# Patient Record
Sex: Male | Born: 1950
Health system: Southern US, Community
[De-identification: ages and names within clinical notes are randomized; demographics above are authoritative.]

## PROBLEM LIST (undated history)

## (undated) DIAGNOSIS — F329 Major depressive disorder, single episode, unspecified: Secondary | ICD-10-CM

## (undated) DIAGNOSIS — Z87828 Personal history of other (healed) physical injury and trauma: Secondary | ICD-10-CM

## (undated) DIAGNOSIS — F419 Anxiety disorder, unspecified: Secondary | ICD-10-CM

## (undated) DIAGNOSIS — E785 Hyperlipidemia, unspecified: Secondary | ICD-10-CM

## (undated) DIAGNOSIS — F0280 Dementia in other diseases classified elsewhere without behavioral disturbance: Secondary | ICD-10-CM

## (undated) DIAGNOSIS — F32A Depression, unspecified: Secondary | ICD-10-CM

## (undated) DIAGNOSIS — T7840XA Allergy, unspecified, initial encounter: Secondary | ICD-10-CM

## (undated) HISTORY — DX: Depression, unspecified: F32.A

## (undated) HISTORY — DX: Personal history of other (healed) physical injury and trauma: Z87.828

## (undated) HISTORY — DX: Hyperlipidemia, unspecified: E78.5

## (undated) HISTORY — DX: Major depressive disorder, single episode, unspecified: F32.9

## (undated) HISTORY — DX: Dementia in other diseases classified elsewhere, unspecified severity, without behavioral disturbance, psychotic disturbance, mood disturbance, and anxiety: F02.80

## (undated) HISTORY — DX: Anxiety disorder, unspecified: F41.9

## (undated) HISTORY — DX: Allergy, unspecified, initial encounter: T78.40XA

---

## 2014-02-12 DIAGNOSIS — Z23 Encounter for immunization: Secondary | ICD-10-CM | POA: Diagnosis not present

## 2015-01-31 DIAGNOSIS — E785 Hyperlipidemia, unspecified: Secondary | ICD-10-CM | POA: Diagnosis not present

## 2015-01-31 DIAGNOSIS — Z23 Encounter for immunization: Secondary | ICD-10-CM | POA: Diagnosis not present

## 2015-01-31 DIAGNOSIS — Z131 Encounter for screening for diabetes mellitus: Secondary | ICD-10-CM | POA: Diagnosis not present

## 2015-01-31 DIAGNOSIS — Z79899 Other long term (current) drug therapy: Secondary | ICD-10-CM | POA: Diagnosis not present

## 2015-01-31 DIAGNOSIS — Z0000 Encounter for general adult medical examination without abnormal findings: Secondary | ICD-10-CM | POA: Diagnosis not present

## 2015-01-31 DIAGNOSIS — Z1211 Encounter for screening for malignant neoplasm of colon: Secondary | ICD-10-CM | POA: Diagnosis not present

## 2015-04-04 DIAGNOSIS — Z1211 Encounter for screening for malignant neoplasm of colon: Secondary | ICD-10-CM | POA: Diagnosis not present

## 2016-03-31 ENCOUNTER — Ambulatory Visit (INDEPENDENT_AMBULATORY_CARE_PROVIDER_SITE_OTHER): Payer: Medicare Other | Admitting: Family Medicine

## 2016-03-31 ENCOUNTER — Encounter: Payer: Self-pay | Admitting: Family Medicine

## 2016-03-31 VITALS — BP 148/80 | HR 92 | Temp 97.8°F | Resp 12 | Ht 66.0 in | Wt 165.4 lb

## 2016-03-31 DIAGNOSIS — R413 Other amnesia: Secondary | ICD-10-CM | POA: Diagnosis not present

## 2016-03-31 DIAGNOSIS — E785 Hyperlipidemia, unspecified: Secondary | ICD-10-CM | POA: Diagnosis not present

## 2016-03-31 DIAGNOSIS — E782 Mixed hyperlipidemia: Secondary | ICD-10-CM | POA: Insufficient documentation

## 2016-03-31 DIAGNOSIS — F411 Generalized anxiety disorder: Secondary | ICD-10-CM | POA: Diagnosis not present

## 2016-03-31 DIAGNOSIS — Z23 Encounter for immunization: Secondary | ICD-10-CM | POA: Diagnosis not present

## 2016-03-31 DIAGNOSIS — R030 Elevated blood-pressure reading, without diagnosis of hypertension: Secondary | ICD-10-CM

## 2016-03-31 DIAGNOSIS — R0789 Other chest pain: Secondary | ICD-10-CM

## 2016-03-31 HISTORY — DX: Generalized anxiety disorder: F41.1

## 2016-03-31 HISTORY — DX: Mixed hyperlipidemia: E78.2

## 2016-03-31 MED ORDER — SIMVASTATIN 20 MG PO TABS: 20.0000 mg | ORAL_TABLET | Freq: Every day | ORAL | 2 refills | Status: DC

## 2016-03-31 MED ORDER — SERTRALINE HCL 25 MG PO TABS: 25.0000 mg | ORAL_TABLET | Freq: Every day | ORAL | 1 refills | Status: DC

## 2016-03-31 MED ORDER — ZETIA 10 MG PO TABS: 10.0000 mg | ORAL_TABLET | Freq: Every day | ORAL | 3 refills | Status: DC

## 2016-03-31 NOTE — Patient Instructions (Addendum)
A few things to remember from today's visit:   Need for immunization against influenza - Plan: Flu vaccine HIGH DOSE PF  Hyperlipidemia, unspecified hyperlipidemia type - Plan: Comprehensive metabolic panel, Lipid panel, simvastatin (ZOCOR) 20 MG tablet, ZETIA 10 MG tablet  Memory problem - Plan: Comprehensive metabolic panel, Vitamin B12, RPR, TSH, CBC with Differential  Generalized anxiety disorder - Plan: sertraline (ZOLOFT) 25 MG tablet  Chest tightness - Plan: EKG 12-Lead  Stop beer intake. Wine max 4-6 oz daily.  Low fat diet. New medication today Sertraline.    Medicare covers a annual preventive visit, which is strongly recommended , it is once per year and involves a series of questions to identify risk factors; so we can try to prevent possible complications. This does not need to be done by a doctor.  We have a nurse Banker(RN) here that is highly qualified to do it, it can be arrange same date you have a follow up appointment with me or labs scheduled, and it 100% covered by Medicare. So before you leave today I would like for you to arrange visit with Ms Montine CircleSusan Hauck for Medicare wellness visit.   Please verify with you insurance if a routine physical is covered.  Monitor blood pressure at home.   Please be sure medication list is accurate. If a new problem present, please set up appointment sooner than planned today.

## 2016-03-31 NOTE — Progress Notes (Signed)
HPI:   [name] is a 65 y.o. male, who is here today to establish care with me.   Former PCP: Dr Randy Bean. Last preventive routine visit: over a year, 01/2015.    Concerns today: Wife has some concerns today and requesting medication refills.   Hyperlipidemia:  Currently on Zocor 20 mg, Niacin 250 mg, and Zetia 10 mg. Niacin causes skin itching and hot flashes, worse with higher doses. Following a low fat diet: Yes.  He has not noted side effects with rest of medication.    Memory loss: Wife noted for about a year, he has not noted any problem and he is not concerned.  Usually when he is out of town away from home. According to wife, sometimes he makes same remarks/questions during conversation. History prescribing and this problem has not affected his social interaction. He denies alcohol abuse, he drinks a glass of wine daily and 2 beers.  He denies headache, visual changes, numbness, tingling, or focal deficit. Denies Hx of CVA.  FHx negative for dementia.   Tightness in chest: Wife also mentions that he is having anxiety chest tightness. He has had intermittent mid/upper chest pain, which wife thinks is related to anxiety. According to patient, he has had negative cardiac workup, when a few years ago he presented to the ER because of chest pain. According to patient , he was told it was anxiety.  Exacerbated by stress and frustration. "Tightness", mild, not radiated, no associated diaphoresis, dyspnea, palpitations, or dizziness. He has not identified alleviating factors, usually resolves spontaneously. When asked about duration he states that it "depends of how many questions" his wife asks at the time. It is not daily, in average every 2 weeks. No reproducible with exercising or intense yard work.    No Hx of GERD.  Denies abdominal pain, nausea, vomiting, or changes in bowel habits.Marland Kitchen   Hx of depression and anxiety for many years. He  denies suicidal thoughts. Retired about 2 years ago.  He attributes anxiety to stress, mainly while he was working. States that Sundays are worse because he talks with his sister.  In regard to insomnia, sometimes he does not sleep well and sometimes he does, in general he gets "enough sleep", 6-7 hours.  FHx Mother with depression and anxiety.  He takes Potassium OTC for leg cramps.  No Hx of HTN, his BP was mildly elevated today, improved after a few minutes. Denies Hx of CVD.  Former smoker, quit over 40 years ago.   Review of Systems  Constitutional: Negative for activity change, appetite change, fatigue, fever and unexpected weight change.  HENT: Negative for mouth sores, nosebleeds, sore throat, trouble swallowing and voice change.   Eyes: Negative for pain, redness and visual disturbance.  Respiratory: Positive for chest tightness. Negative for cough, shortness of breath and wheezing.   Cardiovascular: Negative for palpitations and leg swelling.  Gastrointestinal: Negative for abdominal pain, nausea and vomiting.       No changes in bowel habits  Endocrine: Negative for cold intolerance and heat intolerance.  Genitourinary: Negative for decreased urine volume and hematuria.  Musculoskeletal: Positive for arthralgias. Negative for myalgias.       Hx of OA.  Skin: Negative for color change and rash.  Neurological: Negative for dizziness, seizures, syncope, weakness, numbness and headaches.  Hematological: Negative for adenopathy. Does not bruise/bleed easily.  Psychiatric/Behavioral: Negative for confusion and suicidal ideas. The patient is nervous/anxious.  No current outpatient prescriptions on file prior to visit.   No current facility-administered medications on file prior to visit.      Past Medical History:  Diagnosis Date  . Allergy   . Anxiety   . Depression   . Hyperlipidemia    Not on File  Family History  Problem Relation Age of Onset  .  Mental illness Mother   . COPD Father     Social History   Social History  . Marital status: Married    Spouse name: N/A  . Number of children: N/A  . Years of education: N/A   Social History Main Topics  . Smoking status: Former Smoker    Quit date: 05/24/1973  . Smokeless tobacco: Former NeurosurgeonUser    Types: Chew  . Alcohol use Yes  . Drug use: No  . Sexual activity: Yes   Other Topics Concern  . None   Social History Narrative  . None    Vitals:   03/31/16 0845  BP: (!) 148/80  Pulse: 92  Resp: 12  Temp: 97.8 F (36.6 C)   O2 sat 98% at RA.  Body mass index is 26.69 kg/m.    Physical Exam  Nursing note and vitals reviewed. Constitutional: He is oriented to person, place, and time. He appears well-developed and well-nourished. No distress.  HENT:  Head: Atraumatic.  Mouth/Throat: Oropharynx is clear and moist and mucous membranes are normal.  Eyes: Conjunctivae and EOM are normal. Pupils are equal, round, and reactive to light.  Neck: No JVD present. No tracheal deviation present. No thyroid mass and no thyromegaly present.  Cardiovascular: Normal rate and regular rhythm.   No murmur heard. Pulses:      Dorsalis pedis pulses are 2+ on the right side, and 2+ on the left side.  Respiratory: Effort normal and breath sounds normal. No respiratory distress.  GI: Soft. He exhibits no mass. There is no hepatomegaly. There is no tenderness.  Musculoskeletal: He exhibits no edema.  Lymphadenopathy:    He has no cervical adenopathy.       Right: No supraclavicular adenopathy present.       Left: No supraclavicular adenopathy present.  Neurological: He is alert and oriented to person, place, and time. He has normal strength. He displays tremor (Minimal hand tremor.). No cranial nerve deficit or sensory deficit. Coordination and gait normal.  Reflex Scores:      Bicep reflexes are 2+ on the right side and 2+ on the left side.      Patellar reflexes are 2+ on the right  side and 2+ on the left side. Pronator drift negative.  Skin: Skin is warm. No erythema.  Psychiatric: His mood appears anxious. Cognition and memory are not impaired. He expresses no suicidal ideation.  Well groomed, good eye contact.      ASSESSMENT AND PLAN:     Randy Bean was seen today for establish care.  Diagnoses and all orders for this visit:    Chest tightness  I discussed possible causes, history does not suggest cardiac etiology, he has some risk factors: Hyperlipidemia, age among some. EKG today: NSR,normal axis and intervals, LAE and early repolarization changes. No acute ischemia. No other EKG for comparison. F/U in 6 weeks, before if needed.   He was clearly instructed about warning signs.  -     EKG 12-Lead  Memory problem  We discussed possible etiologies, some lab work done today to evaluate for some of them. Depression/anxiety could also  be associated with memory complaints. Dementia is to be considered, depending on lab results we will consider referral for neuropsychiatric evaluation.   -     Comprehensive metabolic panel; Future -     Vitamin B12; Future -     RPR; Future -     TSH; Future -     CBC with Differential; Future  Hyperlipidemia, unspecified hyperlipidemia type  We discussed some side effects of Niacin and current evidence in regard to mortality CV prevention. He agrees with stopping Niacin. Continue low-fat diet, stop beer intake. Further recommendations would be given according to lab results.  -     Comprehensive metabolic panel; Future -     Lipid panel; Future -     simvastatin (ZOCOR) 20 MG tablet; Take 1 tablet (20 mg total) by mouth daily. -     ZETIA 10 MG tablet; Take 1 tablet (10 mg total) by mouth daily.  Generalized anxiety disorder  After discussion of treatment options as well as some side effects, he agrees with trying low-dose of sertraline. Instructed about warning signs. Follow-up in 6 weeks, before if  needed.  -     sertraline (ZOLOFT) 25 MG tablet; Take 1 tablet (25 mg total) by mouth daily.  Elevated blood pressure reading  Improved, 130/70. Recommended monitoring BP at home.  Need for immunization against influenza -     Flu vaccine HIGH DOSE PF   -He is not fasting today, so he will be back in a week for fasting labs.  45 min face to face OV, > 50% dedicated to discussion of possible etiologies for some of his concerns, insurance coverage in regard to routine physical (wife wanted one today), medication side effects, and plan of care.     Randy Buist G. Swazil[place]Jordan, MD  [facility]. [place] office.

## 2016-03-31 NOTE — Progress Notes (Signed)
Pre visit review using our clinic review tool, if applicable. No additional management support is needed unless otherwise documented below in the visit note. 

## 2016-04-09 ENCOUNTER — Other Ambulatory Visit (INDEPENDENT_AMBULATORY_CARE_PROVIDER_SITE_OTHER): Payer: Medicare Other

## 2016-04-09 DIAGNOSIS — E785 Hyperlipidemia, unspecified: Secondary | ICD-10-CM | POA: Diagnosis not present

## 2016-04-09 DIAGNOSIS — R413 Other amnesia: Secondary | ICD-10-CM | POA: Diagnosis not present

## 2016-04-09 LAB — CBC WITH DIFFERENTIAL/PLATELET
BASOS ABS: 0 10*3/uL (ref 0.0–0.1)
Basophils Relative: 0.2 % (ref 0.0–3.0)
EOS ABS: 0.2 10*3/uL (ref 0.0–0.7)
Eosinophils Relative: 3.5 % (ref 0.0–5.0)
HCT: 42.4 % (ref 39.0–52.0)
Hemoglobin: 14.6 g/dL (ref 13.0–17.0)
LYMPHS ABS: 1.6 10*3/uL (ref 0.7–4.0)
Lymphocytes Relative: 25 % (ref 12.0–46.0)
MCHC: 34.5 g/dL (ref 30.0–36.0)
MCV: 92.5 fl (ref 78.0–100.0)
MONO ABS: 0.5 10*3/uL (ref 0.1–1.0)
Monocytes Relative: 7.7 % (ref 3.0–12.0)
NEUTROS ABS: 4.1 10*3/uL (ref 1.4–7.7)
NEUTROS PCT: 63.6 % (ref 43.0–77.0)
PLATELETS: 189 10*3/uL (ref 150.0–400.0)
RBC: 4.59 Mil/uL (ref 4.22–5.81)
RDW: 12.1 % (ref 11.5–15.5)
WBC: 6.4 10*3/uL (ref 4.0–10.5)

## 2016-04-09 LAB — COMPREHENSIVE METABOLIC PANEL
ALT: 42 U/L (ref 0–53)
AST: 28 U/L (ref 0–37)
Albumin: 4.6 g/dL (ref 3.5–5.2)
Alkaline Phosphatase: 44 U/L (ref 39–117)
BUN: 14 mg/dL (ref 6–23)
CHLORIDE: 102 meq/L (ref 96–112)
CO2: 30 meq/L (ref 19–32)
Calcium: 10 mg/dL (ref 8.4–10.5)
Creatinine, Ser: 0.87 mg/dL (ref 0.40–1.50)
GFR: 92.8 mL/min (ref >60.00)
GLUCOSE: 96 mg/dL (ref 70–99)
POTASSIUM: 4.7 meq/L (ref 3.5–5.1)
SODIUM: 140 meq/L (ref 135–145)
Total Bilirubin: 0.6 mg/dL (ref 0.2–1.2)
Total Protein: 6.9 g/dL (ref 6.0–8.3)

## 2016-04-09 LAB — LIPID PANEL
CHOL/HDL RATIO: 3
Cholesterol: 175 mg/dL (ref 0–200)
HDL: 59.6 mg/dL (ref >39.00)
LDL CALC: 89 mg/dL (ref 0–99)
NONHDL: 115.48
Triglycerides: 131 mg/dL (ref 0.0–149.0)
VLDL: 26.2 mg/dL (ref 0.0–40.0)

## 2016-04-09 LAB — TSH: TSH: 0.8 u[IU]/mL (ref 0.35–4.50)

## 2016-04-09 LAB — VITAMIN B12: VITAMIN B 12: 677 pg/mL (ref 211–911)

## 2016-04-10 ENCOUNTER — Encounter: Payer: Self-pay | Admitting: Family Medicine

## 2016-04-10 LAB — RPR

## 2016-05-12 ENCOUNTER — Encounter: Payer: Self-pay | Admitting: Family Medicine

## 2016-05-12 ENCOUNTER — Ambulatory Visit (INDEPENDENT_AMBULATORY_CARE_PROVIDER_SITE_OTHER): Payer: Medicare Other | Admitting: Family Medicine

## 2016-05-12 VITALS — BP 118/78 | HR 82 | Resp 12 | Ht 66.0 in | Wt 167.1 lb

## 2016-05-12 DIAGNOSIS — R413 Other amnesia: Secondary | ICD-10-CM

## 2016-05-12 DIAGNOSIS — F411 Generalized anxiety disorder: Secondary | ICD-10-CM | POA: Diagnosis not present

## 2016-05-12 MED ORDER — SERTRALINE HCL 25 MG PO TABS: 25.0000 mg | ORAL_TABLET | Freq: Every day | ORAL | 1 refills | Status: DC

## 2016-05-12 NOTE — Progress Notes (Signed)
Pre visit review using our clinic review tool, if applicable. No additional management support is needed unless otherwise documented below in the visit note. 

## 2016-05-12 NOTE — Patient Instructions (Addendum)
A few things to remember from today's visit:   Generalized anxiety disorder - Plan: sertraline (ZOLOFT) 25 MG tablet  Memory difficulties  No changes today.    Please be sure medication list is accurate. If a new problem present, please set up appointment sooner than planned today.

## 2016-05-12 NOTE — Progress Notes (Signed)
HPI:   Randy Bean is a 65 y.o. male, who is here today with his wife to follow on anxiety. I last saw him 03/31/16 when he was started on Sertraline 25 mg daily.   He has noted improvement in anxiety, he is not longer having chest discomfort and feeling nervous. Denies severe/frequent headache, visual changes, chest pain, dyspnea, palpitation, claudication, focal weakness, or edema.  He is reporting great deal of improvement. Well tolerated, denies any side effect.  He denies depressed mood or suicidal thoughts.  Mild intermittent insomnia, unchanged.  His wife was also concerned about memory problems. She states that it has not changed, it happens some days ,  For example he asks 2-3 times during the day what is for dinner. She has not noted repetition of same phrase or remark in a conversation. It is not very frequent and does not seem to be affecting social interaction or daily activities.    No new concerns today.     Review of Systems  Constitutional: Negative for appetite change, fatigue, fever and unexpected weight change.  Cardiovascular: Negative for chest pain, palpitations and leg swelling.  Gastrointestinal: Negative for abdominal pain, nausea and vomiting.       No changes in bowel movements.  Skin: Negative for rash.  Neurological: Negative for syncope, speech difficulty, weakness, numbness and headaches.  Psychiatric/Behavioral: Positive for sleep disturbance. Negative for confusion and suicidal ideas. The patient is not nervous/anxious.       Current Outpatient Prescriptions on File Prior to Visit  Medication Sig Dispense Refill  . aspirin 325 MG tablet Take 325 mg by mouth daily.    . Coenzyme Q10 (COQ10) 100 MG CAPS Take 1 capsule by mouth daily.    . Flax OIL Take 1 tablet by mouth daily.    . Garlic 1000 MG CAPS Take 1 capsule by mouth daily.    Marland Kitchen. loratadine (CLARITIN) 10 MG tablet Take 10 mg by mouth daily.    . Melatonin 5 MG TABS  Take 1 tablet by mouth daily.    . Omega 3 1200 MG CAPS Take 1 capsule by mouth daily.    . Potassium Gluconate 595 MG CAPS Take 1 capsule by mouth daily.    . simvastatin (ZOCOR) 20 MG tablet Take 1 tablet (20 mg total) by mouth daily. 90 tablet 2  . Turmeric 500 MG TABS Take 1 tablet by mouth daily.    . vitamin C (ASCORBIC ACID) 500 MG tablet Take 500 mg by mouth daily.    Marland Kitchen. ZETIA 10 MG tablet Take 1 tablet (10 mg total) by mouth daily. 90 tablet 3   No current facility-administered medications on file prior to visit.      Past Medical History:  Diagnosis Date  . Allergy   . Anxiety   . Depression   . Hyperlipidemia    Not on File  Social History   Social History  . Marital status: Married    Spouse name: N/A  . Number of children: N/A  . Years of education: N/A   Social History Main Topics  . Smoking status: Former Smoker    Quit date: 05/24/1973  . Smokeless tobacco: Former NeurosurgeonUser    Types: Chew  . Alcohol use Yes  . Drug use: No  . Sexual activity: Yes   Other Topics Concern  . None   Social History Narrative  . None    Vitals:   05/12/16 1018  BP: 118/78  Pulse: 82  Resp: 12   Body mass index is 26.97 kg/m.    Physical Exam  Nursing note and vitals reviewed. Constitutional: He is oriented to person, place, and time. He appears well-developed. No distress.  HENT:  Head: Atraumatic.  Mouth/Throat: Oropharynx is clear and moist and mucous membranes are normal.  Eyes: Conjunctivae and EOM are normal.  Cardiovascular: Normal rate and regular rhythm.   No murmur heard. Present DP bilateral.  Respiratory: Effort normal and breath sounds normal. No respiratory distress.  GI: Soft. He exhibits no mass. There is no hepatomegaly. There is no tenderness.  Musculoskeletal: He exhibits no edema.  Neurological: He is alert and oriented to person, place, and time. He has normal strength. He displays tremor (minimal ,hands,with intension). Gait normal.  Skin:  Skin is warm. No erythema.  Psychiatric: He has a normal mood and affect. Cognition and memory are normal.  Well groomed, good eye contact.      ASSESSMENT AND PLAN:     Randy MaduroRobert was seen today for follow-up.  Diagnoses and all orders for this visit:  Generalized anxiety disorder  Reporting improvement. No changes in  current management. Some side effects discussed. F/U in 5-6 months.   -     sertraline (ZOLOFT) 25 MG tablet; Take 1 tablet (25 mg total) by mouth daily.  Memory difficulties  Neurologic examination today otherwise normal. We discussed some possible causes and treatment options available. Lab work done last OV negative.  Wife and Randy Bean prefer to hod on neuropsychiatric evaluation evaluation. They are planning on travelling out of state in 07/2016, wife will let me know after trip if she changes her mind in regard to neuro evaluation. Instructed about warning signs.  F/U in 5-6 months.     -Randy Bean was advised to return sooner than planned today if new concerns arise.       Betty G. [place]Jordan, MD  [facility]. [place] office.

## 2016-05-31 ENCOUNTER — Other Ambulatory Visit: Payer: Self-pay | Admitting: Family Medicine

## 2016-05-31 DIAGNOSIS — F411 Generalized anxiety disorder: Secondary | ICD-10-CM

## 2016-06-01 ENCOUNTER — Other Ambulatory Visit: Payer: Self-pay | Admitting: Family Medicine

## 2016-06-01 DIAGNOSIS — F411 Generalized anxiety disorder: Secondary | ICD-10-CM

## 2016-08-26 ENCOUNTER — Other Ambulatory Visit: Payer: Self-pay | Admitting: Family Medicine

## 2016-08-26 DIAGNOSIS — F411 Generalized anxiety disorder: Secondary | ICD-10-CM

## 2016-08-30 ENCOUNTER — Encounter: Payer: Self-pay | Admitting: Family Medicine

## 2016-12-07 ENCOUNTER — Other Ambulatory Visit: Payer: Self-pay | Admitting: Family Medicine

## 2016-12-07 ENCOUNTER — Other Ambulatory Visit: Payer: Self-pay

## 2016-12-07 DIAGNOSIS — E785 Hyperlipidemia, unspecified: Secondary | ICD-10-CM

## 2016-12-07 MED ORDER — ZETIA 10 MG PO TABS: 10.0000 mg | ORAL_TABLET | Freq: Every day | ORAL | 1 refills | Status: DC

## 2016-12-07 NOTE — Telephone Encounter (Signed)
Was sent to [facility].  Received fax to send to [facility].

## 2017-01-13 ENCOUNTER — Other Ambulatory Visit: Payer: Self-pay | Admitting: Family Medicine

## 2017-01-13 DIAGNOSIS — F411 Generalized anxiety disorder: Secondary | ICD-10-CM

## 2017-02-10 ENCOUNTER — Encounter: Payer: Self-pay | Admitting: Family Medicine

## 2017-03-21 DIAGNOSIS — Z23 Encounter for immunization: Secondary | ICD-10-CM | POA: Diagnosis not present

## 2017-03-31 NOTE — Progress Notes (Signed)
HPI:  [name] is a 66 y.o.male here today with his wife for his routine physical examination.  Last CPE: 01/2015  He lives with wife.  Regular exercise 3 or more times per week: Walking 5-6 times per week. Following a healthy diet: Yes.   Chronic medical problems: Generalized anxiety disorder, depression hyperlipidemia,insomnia, and memory difficulties.   Independent ADL's and IADL's. No falls in the past year and denies depression symptoms.  Functional Status Survey: Is the patient deaf or have difficulty hearing?: No Does the patient have difficulty seeing, even when wearing glasses/contacts?: No Does the patient have difficulty concentrating, remembering, or making decisions?: Yes Does the patient have difficulty walking or climbing stairs?: No Does the patient have difficulty dressing or bathing?: No Does the patient have difficulty doing errands alone such as visiting a doctor's office or shopping?: No  Fall Risk  04/01/2017  Falls in the past year? No     Providers he sees regularly:  Eye care provider: 3 years ago, does not recall his name.   [facility]  Decreased Interest 0  Down, Depressed, Hopeless 0  PHQ - 2 Score 0    Mini-Cog - 04/01/17 0927    Normal clock drawing test?  no    How many words correct?  3         Hearing Screening   125Hz  250Hz  500Hz  1000Hz  2000Hz  3000Hz  4000Hz  6000Hz  8000Hz   Right ear:   Pass Pass Pass  Pass    Left ear:   Pass Pass Pass  pass      Visual Acuity Screening   Right eye Left eye Both eyes  Without correction:     With correction: 20/40 20/20 20/20      Immunization History  Administered Date(s) Administered  . Hepatitis A 07/15/1994, 01/15/1995  . Hepatitis B 07/15/1994, 09/15/1994, 01/15/1995  . Influenza, High Dose Seasonal PF 03/31/2016  . Meningococcal Conjugate 07/15/1994  . Tdap 02/27/2007  . Zoster 02/12/2014      -Hep C screening: Never done in the  past, he agrees with having it today.   Last colon cancer screening: 03/2015 10 years follow up recommended. Last prostate ca screening: About 4 years ago. Nocturia once every night. Denies dysuria,increased urinary frequency, gross hematuria,or decreased urine output.   -Denies high alcohol intake or Hx of illicit drug use. A glass of wine at night.  -Former smoker.  -Concerns and/or follow up today:    Hyperlipidemia:  Currently on Zocor 20 mg daily and Zetia 10 mg daily. Following a low fat diet: Yes.  He has not noted side effects with medication.  Lab Results  Component Value Date   CHOL 175 04/09/2016   HDL 59.60 04/09/2016   LDLCALC 89 04/09/2016   TRIG 131.0 04/09/2016   CHOLHDL 3 04/09/2016    Anxiety and depression: Currently he is on Zoloft 25 mg daily.  He is tolerating medication well and denies any side effects.  His wife has noted improvement in his mood.  He denies insomnia or suicidal thoughts.  Memory issues reported by his wife in the past, he does not think he has problems with his memory. According to his wife problem is stable. Last office visit neuropsychiatric evaluation was recommended but they decided to hold on it. Problem has not affected social interaction, he is still driving and independent.   Review of Systems  Constitutional: Negative for activity change, appetite change, fatigue and fever.  HENT: Negative  for dental problem, nosebleeds, sore throat, trouble swallowing and voice change.   Eyes: Negative for redness and visual disturbance.  Respiratory: Negative for cough, shortness of breath and wheezing.   Cardiovascular: Negative for chest pain, palpitations and leg swelling.  Gastrointestinal: Negative for abdominal pain, blood in stool, nausea and vomiting.       No changes in bowel habits.  Endocrine: Negative for cold intolerance, heat intolerance, polydipsia, polyphagia and polyuria.  Genitourinary: Negative for decreased urine  volume, dysuria, genital sores, hematuria and testicular pain.  Musculoskeletal: Negative for gait problem and myalgias.  Skin: Negative for color change and rash.  Allergic/Immunologic: Positive for environmental allergies.  Neurological: Negative for dizziness, seizures, syncope, weakness, numbness and headaches.  Hematological: Negative for adenopathy. Does not bruise/bleed easily.  Psychiatric/Behavioral: Negative for confusion, hallucinations, sleep disturbance and suicidal ideas. The patient is not nervous/anxious.   All other systems reviewed and are negative.    Current Outpatient Medications on File Prior to Visit  Medication Sig Dispense Refill  . aspirin 325 MG tablet Take 325 mg by mouth daily.    . Coenzyme Q10 (COQ10) 100 MG CAPS Take 1 capsule by mouth daily.    . Flax OIL Take 1 tablet by mouth daily.    . Garlic 1000 MG CAPS Take 1 capsule by mouth daily.    Marland Kitchen loratadine (CLARITIN) 10 MG tablet Take 10 mg by mouth daily.    . Melatonin 5 MG TABS Take 1 tablet by mouth daily.    . Omega 3 1200 MG CAPS Take 1 capsule by mouth daily.    . Potassium Gluconate 595 MG CAPS Take 1 capsule by mouth daily.    . simvastatin (ZOCOR) 20 MG tablet Take 1 tablet (20 mg total) by mouth daily. 90 tablet 2  . Turmeric 500 MG TABS Take 1 tablet by mouth daily.    . vitamin C (ASCORBIC ACID) 500 MG tablet Take 500 mg by mouth daily.     No current facility-administered medications on file prior to visit.      Past Medical History:  Diagnosis Date  . Allergy   . Anxiety   . Depression   . Hyperlipidemia     History reviewed. No pertinent surgical history.  Not on File  Family History  Problem Relation Age of Onset  . Mental illness Mother   . COPD Father     Social History   Socioeconomic History  . Marital status: Married    Spouse name: None  . Number of children: None  . Years of education: None  . Highest education level: None  Social Needs  . Financial  resource strain: None  . Food insecurity - worry: None  . Food insecurity - inability: None  . Transportation needs - medical: None  . Transportation needs - non-medical: None  Occupational History  . None  Tobacco Use  . Smoking status: Former Smoker    Last attempt to quit: 05/24/1973    Years since quitting: 43.8  . Smokeless tobacco: Former Neurosurgeon    Types: Chew  Substance and Sexual Activity  . Alcohol use: Yes  . Drug use: No  . Sexual activity: Yes  Other Topics Concern  . None  Social History Narrative  . None     Vitals:   04/01/17 0848  BP: 122/76  Pulse: 70  Temp: 98.1 F (36.7 C)  SpO2: 96%   Body mass index is 25.91 kg/m.   Wt Readings from Last 3  Encounters:  04/01/17 160 lb 8 oz (72.8 kg)  05/12/16 167 lb 2 oz (75.8 kg)  03/31/16 165 lb 6 oz (75 kg)    Physical Exam  Nursing note and vitals reviewed. Constitutional: He appears well-developed and well-nourished. No distress.  HENT:  Head: Normocephalic and atraumatic.  Right Ear: Hearing, tympanic membrane, external ear and ear canal normal.  Left Ear: Hearing, tympanic membrane, external ear and ear canal normal.  Mouth/Throat: Oropharynx is clear and moist and mucous membranes are normal.  Eyes: Conjunctivae and EOM are normal. Pupils are equal, round, and reactive to light.  Neck: Normal range of motion. No JVD present. No tracheal deviation present. No thyromegaly present.  Cardiovascular: Normal rate and regular rhythm.  No murmur heard. Pulses:      Dorsalis pedis pulses are 2+ on the right side, and 2+ on the left side.  Respiratory: Effort normal and breath sounds normal. No respiratory distress.  GI: Soft. He exhibits no mass. There is no tenderness.  Genitourinary:  Genitourinary Comments: Refused,no concerns.  Musculoskeletal: He exhibits no edema or tenderness.  No major deformities appreciated and no signs of synovitis.  Lymphadenopathy:    He has no cervical adenopathy.        Right: No supraclavicular adenopathy present.       Left: No supraclavicular adenopathy present.  Neurological: He is alert. He has normal strength. No cranial nerve deficit or sensory deficit. Coordination and gait normal.  Reflex Scores:      Bicep reflexes are 2+ on the right side and 2+ on the left side.      Patellar reflexes are 2+ on the right side and 2+ on the left side. Overall he is oriented, date given today: 03/02/2017 Clock test: 11:05 instead 11:10.  Skin: Skin is warm. No rash noted. No erythema.  No suspicious lesions appreciated.  Psychiatric: He has a normal mood and affect.  Well-groomed, good eye contact.     ASSESSMENT AND PLAN:   Mr.Yahye was seen today for annual exam.  Diagnoses and all orders for this visit:  Lab Results  Component Value Date   ALT 34 04/01/2017   AST 22 04/01/2017   ALKPHOS 46 04/01/2017   BILITOT 0.7 04/01/2017   Lab Results  Component Value Date   PSA 0.07 (L) 04/01/2017   Lab Results  Component Value Date   CREATININE 0.83 04/01/2017   BUN 13 04/01/2017   NA 139 04/01/2017   K 4.2 04/01/2017   CL 101 04/01/2017   CO2 31 04/01/2017   Lab Results  Component Value Date   CHOL 173 04/01/2017   HDL 49.10 04/01/2017   LDLCALC 92 04/01/2017   TRIG 159.0 (H) 04/01/2017   CHOLHDL 4 04/01/2017    Routine general medical examination at a health care facility  Preventive guidelines reviewed. Aspirin for primary prevention discussed, decrease to 81 mg daily.  Next CPE in a year.  The 10-year ASCVD risk score Denman George DC Montez Hageman., et al., 2013) is: 13.6%   Values used to calculate the score:     Age: 34 years     Sex: Male     Is Non-Hispanic African American: No     Diabetic: No     Tobacco smoker: No     Systolic Blood Pressure: 122 mmHg     Is BP treated: No     HDL Cholesterol: 49.1 mg/dL     Total Cholesterol: 173 mg/dL  Medicare annual wellness visit, subsequent  We  discussed the importance of staying active,  physically and mentally, as well as the benefits of a healthy/balnace diet. Low impact exercise that involve stretching and strengthing are ideal. Vaccines: Pneumovax given here today. Tdap and Shingrex recommended (at the pharmacy due to insurance coverage). We discussed preventive screening for the next 5-10 years, summery of recommendations given in AVS: colonoscopy due in 2026. Overdue for eye exam,glaucoma screening. Diabetes screening every 1-3 years. AAA recommended but his wife thinks it is not covered by his insurance. I tried to placed order but it required signing a waver. Instructed pt and his wife to find out and to let me know. Fall prevention. PSA and prostate cancer recommendations discussed.  Advance directives and end of life discussed, he doe snot have end of life planing. Web site information given on his AVS, strongly recommended.  Mild cognitive impairment, which seems to be stable and currently is not affecting his daily social interaction.  I recommend neuropsychiatric evaluation but he had his wife prefer to hold on this for now.  Recommend activities that challenge memory, crosswords,chest,reading among some.    Encounter for HCV screening test for high risk patient -     Hepatitis C antibody screen  Hyperlipidemia, unspecified hyperlipidemia type  No changes in current management, will follow labs done today and will give further recommendations accordingly. Follow-up in 6-12 months.  -     Comprehensive metabolic panel -     Lipid panel  BPH associated with nocturia  Mild and stable.  -     PSA(Must document that pt has been informed of limitations of PSA testing.)  Generalized anxiety disorder  Improved. No changes in current management. Follow-up in 6 months.  -     sertraline (ZOLOFT) 25 MG tablet; Take 1 tablet (25 mg total) daily by mouth.  Need for 23-polyvalent pneumococcal polysaccharide vaccine -     Pneumococcal polysaccharide vaccine  23-valent greater than or equal to 2yo subcutaneous/IM   Return in 6 months (on 09/29/2017) for anxiety,memory.    Kaiyu Mirabal G. [place], MD  [facility]. [place] office.

## 2017-04-01 ENCOUNTER — Other Ambulatory Visit: Payer: Self-pay | Admitting: *Deleted

## 2017-04-01 ENCOUNTER — Encounter: Payer: Self-pay | Admitting: Family Medicine

## 2017-04-01 ENCOUNTER — Ambulatory Visit (INDEPENDENT_AMBULATORY_CARE_PROVIDER_SITE_OTHER): Payer: Medicare Other | Admitting: Family Medicine

## 2017-04-01 ENCOUNTER — Telehealth: Payer: Self-pay | Admitting: Family Medicine

## 2017-04-01 VITALS — BP 122/76 | HR 70 | Temp 98.1°F | Ht 66.0 in | Wt 160.5 lb

## 2017-04-01 DIAGNOSIS — Z23 Encounter for immunization: Secondary | ICD-10-CM

## 2017-04-01 DIAGNOSIS — R351 Nocturia: Secondary | ICD-10-CM

## 2017-04-01 DIAGNOSIS — Z0000 Encounter for general adult medical examination without abnormal findings: Secondary | ICD-10-CM | POA: Diagnosis not present

## 2017-04-01 DIAGNOSIS — E785 Hyperlipidemia, unspecified: Secondary | ICD-10-CM

## 2017-04-01 DIAGNOSIS — Z87891 Personal history of nicotine dependence: Secondary | ICD-10-CM | POA: Insufficient documentation

## 2017-04-01 DIAGNOSIS — Z9189 Other specified personal risk factors, not elsewhere classified: Secondary | ICD-10-CM

## 2017-04-01 DIAGNOSIS — Z1159 Encounter for screening for other viral diseases: Secondary | ICD-10-CM | POA: Diagnosis not present

## 2017-04-01 DIAGNOSIS — N401 Enlarged prostate with lower urinary tract symptoms: Secondary | ICD-10-CM | POA: Diagnosis not present

## 2017-04-01 DIAGNOSIS — F411 Generalized anxiety disorder: Secondary | ICD-10-CM | POA: Diagnosis not present

## 2017-04-01 LAB — LIPID PANEL
Cholesterol: 173 mg/dL (ref 0–200)
HDL: 49.1 mg/dL (ref >39.00)
LDL Cholesterol: 92 mg/dL (ref 0–99)
NonHDL: 123.42
Total CHOL/HDL Ratio: 4
Triglycerides: 159 mg/dL — ABNORMAL HIGH (ref 0.0–149.0)
VLDL: 31.8 mg/dL (ref 0.0–40.0)

## 2017-04-01 LAB — PSA: PSA: 0.07 ng/mL — AB (ref 0.10–4.00)

## 2017-04-01 LAB — COMPREHENSIVE METABOLIC PANEL
ALBUMIN: 4.5 g/dL (ref 3.5–5.2)
ALK PHOS: 46 U/L (ref 39–117)
ALT: 34 U/L (ref 0–53)
AST: 22 U/L (ref 0–37)
BUN: 13 mg/dL (ref 6–23)
CHLORIDE: 101 meq/L (ref 96–112)
CO2: 31 mEq/L (ref 19–32)
Calcium: 9.9 mg/dL (ref 8.4–10.5)
Creatinine, Ser: 0.83 mg/dL (ref 0.40–1.50)
GFR: 97.69 mL/min (ref >60.00)
Glucose, Bld: 101 mg/dL — ABNORMAL HIGH (ref 70–99)
POTASSIUM: 4.2 meq/L (ref 3.5–5.1)
SODIUM: 139 meq/L (ref 135–145)
TOTAL PROTEIN: 6.9 g/dL (ref 6.0–8.3)
Total Bilirubin: 0.7 mg/dL (ref 0.2–1.2)

## 2017-04-01 MED ORDER — SERTRALINE HCL 25 MG PO TABS: 25.0000 mg | ORAL_TABLET | Freq: Every day | ORAL | 2 refills | Status: DC

## 2017-04-01 MED ORDER — ZETIA 10 MG PO TABS: 10.0000 mg | ORAL_TABLET | Freq: Every day | ORAL | 1 refills | Status: DC

## 2017-04-01 NOTE — Telephone Encounter (Signed)
Rx sent to pharmacy   

## 2017-04-01 NOTE — Patient Instructions (Addendum)
A few things to remember from today's visit:   Routine general medical examination at a health care facility  Encounter for HCV screening test for high risk patient - Plan: Hepatitis C antibody screen  Diabetes mellitus screening  Hyperlipidemia, unspecified hyperlipidemia type - Plan: Comprehensive metabolic panel, Lipid panel  Screening for AAA (abdominal aortic aneurysm)  Prostate cancer screening  Former tobacco use  BPH associated with nocturia - Plan: PSA(Must document that pt has been informed of limitations of PSA testing.)   A few tips:  -As we age balance is not as good as it was, so there is a higher risks for falls. Please remove small rugs and furniture that is "in your way" and could increase the risk of falls. Stretching exercises may help with fall prevention: Yoga and Tai Chi are some examples. Low impact exercise is better, so you are not very achy the next day.  -Sun screen and avoidance of direct sun light recommended. Caution with dehydration, if working outdoors be sure to drink enough fluids.  - Some medications are not safe as we age, increases the risk of side effects and can potentially interact with other medication you are also taken;  including some of over the counter medications. Be sure to let me know when you start a new medication even if it is a dietary/vitamin supplement.   -Healthy diet low in red meet/animal fat and sugar + regular physical activity is recommended.       Screening schedule for the next 5-10 years:  Colonoscopy in 2026.  Glaucoma screening/eye exam every 1-2 years.  Flu vaccine annually.  Diabetes screening annually.  Fall prevention   Abdominal aortic aneurysm screening if you ever smoke. Ir seems like your insurance does not cover ultrasound.    Advance directives:  Please see a lawyer and/or go to this website to help you with advanced directives and designating a health care power of attorney so that your  wishes will be followed should you become too ill to make your own medical decisions.  [email or web address] Please be sure medication list is accurate. If a new problem present, please set up appointment sooner than planned today.

## 2017-04-01 NOTE — Telephone Encounter (Signed)
° ° ° ° °  Pt said he takes the generic of the below med and ask if the generic can be called in to [facility]     ZETIA 10 MG tablet

## 2017-04-01 NOTE — Telephone Encounter (Signed)
° ° °  Pt request refill of the following:  ZETIA 10 MG tablet   Phamacy:  [facility] [address]

## 2017-04-01 NOTE — Telephone Encounter (Signed)
Rx resent to pharmacy

## 2017-04-02 ENCOUNTER — Encounter: Payer: Self-pay | Admitting: Family Medicine

## 2017-04-02 LAB — HEPATITIS C ANTIBODY
HEP C AB: NONREACTIVE
SIGNAL TO CUT-OFF: 0 (ref <1.00)

## 2017-04-13 ENCOUNTER — Other Ambulatory Visit: Payer: Self-pay | Admitting: Family Medicine

## 2017-04-13 DIAGNOSIS — E785 Hyperlipidemia, unspecified: Secondary | ICD-10-CM

## 2017-07-07 DIAGNOSIS — R69 Illness, unspecified: Secondary | ICD-10-CM | POA: Diagnosis not present

## 2017-09-26 ENCOUNTER — Encounter: Payer: Self-pay | Admitting: Family Medicine

## 2017-09-29 NOTE — Progress Notes (Signed)
HPI:   [name] is a 67 y.o. male, who is here today with his wife for 6 months follow up.   He was last seen on 04/01/17  He is on Sertraline 25 mg daily for anxiety. Last visit his wife was reporting some memory issues, he decided to hold on neuro evaluation. According to his wife,his memory has been stable,it is not affecting social interaction.  He does not think he has problems with memory,he thinks his wife is "exaggerating."   Sertraline has helped with anxiety. His wife is concerned about him being "a little bit paranoic." States that he is afraid of walking in the woods alone,which he has done before.She thinks that medication helped for the first 6-7 weeks but does not seem to be helping now  He denies suicidal thought or depressed mood.  He needs refills for Zetia 10 mg. He is also on Simvastatin 20 mg daily.  Tolerating medications well.   Lab Results  Component Value Date   CHOL 173 04/01/2017   HDL 49.10 04/01/2017   LDLCALC 92 04/01/2017   TRIG 159.0 (H) 04/01/2017   CHOLHDL 4 04/01/2017     Review of Systems  Constitutional: Negative for activity change, appetite change and fatigue.  HENT: Negative for mouth sores, sore throat and trouble swallowing.   Respiratory: Negative for shortness of breath and wheezing.   Cardiovascular: Negative for chest pain and palpitations.  Gastrointestinal: Negative for abdominal pain, nausea and vomiting.  Neurological: Negative for weakness and headaches.  Psychiatric/Behavioral: Negative for confusion, hallucinations and suicidal ideas. The patient is nervous/anxious.       Current Outpatient Medications on File Prior to Visit  Medication Sig Dispense Refill  . aspirin 325 MG tablet Take 325 mg by mouth daily.    . Coenzyme Q10 (COQ10) 100 MG CAPS Take 1 capsule by mouth daily.    . Flax OIL Take 1 tablet by mouth daily.    . Garlic 1000 MG CAPS Take 1 capsule by mouth daily.    Marland Kitchen loratadine  (CLARITIN) 10 MG tablet Take 10 mg by mouth daily.    . Melatonin 5 MG TABS Take 1 tablet by mouth daily.    . Omega 3 1200 MG CAPS Take 1 capsule by mouth daily.    . Potassium Gluconate 595 MG CAPS Take 1 capsule by mouth daily.    . simvastatin (ZOCOR) 20 MG tablet TAKE ONE TABLET BY MOUTH ONCE DAILY 90 tablet 2  . Turmeric 500 MG TABS Take 1 tablet by mouth daily.    . vitamin C (ASCORBIC ACID) 500 MG tablet Take 500 mg by mouth daily.     No current facility-administered medications on file prior to visit.      Past Medical History:  Diagnosis Date  . Allergy   . Anxiety   . Depression   . Hyperlipidemia    Not on File  Social History   Socioeconomic History  . Marital status: Married    Spouse name: Not on file  . Number of children: Not on file  . Years of education: Not on file  . Highest education level: Not on file  Occupational History  . Not on file  Social Needs  . Financial resource strain: Not on file  . Food insecurity:    Worry: Not on file    Inability: Not on file  . Transportation needs:    Medical: Not on file    Non-medical: Not on  file  Tobacco Use  . Smoking status: Former Smoker    Last attempt to quit: 05/24/1973    Years since quitting: 44.3  . Smokeless tobacco: Former Neurosurgeon    Types: Chew  Substance and Sexual Activity  . Alcohol use: Yes  . Drug use: No  . Sexual activity: Yes  Lifestyle  . Physical activity:    Days per week: Not on file    Minutes per session: Not on file  . Stress: Not on file  Relationships  . Social connections:    Talks on phone: Not on file    Gets together: Not on file    Attends religious service: Not on file    Active member of club or organization: Not on file    Attends meetings of clubs or organizations: Not on file    Relationship status: Not on file  Other Topics Concern  . Not on file  Social History Narrative  . Not on file    Vitals:   09/30/17 0828  BP: 130/70  Pulse: 72  Resp: 12    Temp: 98.2 F (36.8 C)  SpO2: 96%   Body mass index is 25.66 kg/m.   Physical Exam  Nursing note reviewed. Constitutional: He is oriented to person, place, and time. He appears well-developed. No distress.  HENT:  Head: Normocephalic and atraumatic.  Mouth/Throat: Oropharynx is clear and moist and mucous membranes are normal.  Eyes: Pupils are equal, round, and reactive to light. Conjunctivae are normal.  Cardiovascular: Normal rate and regular rhythm.  No murmur heard. Pulses:      Dorsalis pedis pulses are 2+ on the right side, and 2+ on the left side.  Respiratory: Effort normal and breath sounds normal. No respiratory distress.  GI: Soft. He exhibits no mass. There is no hepatomegaly. There is no tenderness.  Musculoskeletal: He exhibits no edema.  Lymphadenopathy:    He has no cervical adenopathy.  Neurological: He is alert and oriented to person, place, and time. He has normal strength. Gait normal.  Does not recall day,date (09/2017),easter.  Skin: Skin is warm. No rash noted. No erythema.  Psychiatric: He has a normal mood and affect. Cognition and memory are normal.  Well groomed, good eye contact.      ASSESSMENT AND PLAN:   [name] was seen today for 6 months follow-up.   1. Generalized anxiety disorder  Not well controlled. Sertraline increased from 25 mg to 50 mg daily. Instructed to let me know in 6-8 weeks how he is doing with new dose of Sertraline. F/U in 6 months,before if needed.  - sertraline (ZOLOFT) 50 MG tablet; Take 1 tablet (50 mg total) by mouth daily.  Dispense: 30 tablet; Refill: 1  2. Hyperlipidemia, unspecified hyperlipidemia type  No changes in current management. F/U in 6 months.  - ZETIA 10 MG tablet; Take 1 tablet (10 mg total) by mouth daily.  Dispense: 90 tablet; Refill: 1     Randy Randa G. [place], MD  [facility]. [place] office.

## 2017-09-30 ENCOUNTER — Encounter: Payer: Self-pay | Admitting: Family Medicine

## 2017-09-30 ENCOUNTER — Ambulatory Visit (INDEPENDENT_AMBULATORY_CARE_PROVIDER_SITE_OTHER): Payer: Medicare HMO | Admitting: Family Medicine

## 2017-09-30 VITALS — BP 130/70 | HR 72 | Temp 98.2°F | Resp 12 | Ht 66.0 in | Wt 159.0 lb

## 2017-09-30 DIAGNOSIS — E785 Hyperlipidemia, unspecified: Secondary | ICD-10-CM

## 2017-09-30 DIAGNOSIS — F411 Generalized anxiety disorder: Secondary | ICD-10-CM

## 2017-09-30 DIAGNOSIS — R69 Illness, unspecified: Secondary | ICD-10-CM | POA: Diagnosis not present

## 2017-09-30 MED ORDER — ZOSTER VAC RECOMB ADJUVANTED 50 MCG/0.5ML IM SUSR: INTRAMUSCULAR | 1 refills | Status: DC

## 2017-09-30 MED ORDER — ZETIA 10 MG PO TABS: 10.0000 mg | ORAL_TABLET | Freq: Every day | ORAL | 1 refills | Status: DC

## 2017-09-30 MED ORDER — SERTRALINE HCL 50 MG PO TABS: 50.0000 mg | ORAL_TABLET | Freq: Every day | ORAL | 1 refills | Status: DC

## 2017-09-30 NOTE — Patient Instructions (Addendum)
A few things to remember from today's visit:   Generalized anxiety disorder - Plan: sertraline (ZOLOFT) 50 MG tablet  Hyperlipidemia, unspecified hyperlipidemia type - Plan: ZETIA 10 MG tablet  In 6-8 week I need to know how you are doing with new Sertraline dose.  Please be sure medication list is accurate. If a new problem present, please set up appointment sooner than planned today.

## 2017-11-15 ENCOUNTER — Other Ambulatory Visit: Payer: Self-pay | Admitting: Family Medicine

## 2017-11-15 DIAGNOSIS — F411 Generalized anxiety disorder: Secondary | ICD-10-CM

## 2017-11-15 NOTE — Telephone Encounter (Signed)
Copied from CRM [phone]#[id number]. Topic: General - Other >> Nov 15, 2017 10:41 AM Marylen PontoMcneil, Ja-Kwan wrote: Reason for CRM: Pt wife Britta MccreedyBarbara states pt is doing well on the sertraline (ZOLOFT) 50 MG tablet. Cb# [phone]

## 2017-12-08 ENCOUNTER — Telehealth: Payer: Self-pay | Admitting: Family Medicine

## 2017-12-08 NOTE — Telephone Encounter (Signed)
Copied from CRM [phone]. Topic: Quick Communication - Rx Refill/Question >> Dec 08, 2017 11:07 AM Alexander BergeronBarksdale, Randy Bean  Medication: sertraline (ZOLOFT) 50 MG tablet [[id number]]   Has the patient contacted their pharmacy? Yes.   (Agent: If no, request that the patient contact the pharmacy for the refill.) (Agent: If yes, when and what did the pharmacy advise?)  Preferred Pharmacy (with phone number or street name): [facility]  Agent: Please be advised that RX Bean may take up to 3 business days. We ask that you follow-up with your pharmacy.

## 2017-12-08 NOTE — Telephone Encounter (Signed)
Left message on cell phone for pt to contact pharmacy for refill on medication. Prescription was sent to pharmacy on 11/16/17 #90 with 1 refill

## 2017-12-09 ENCOUNTER — Encounter: Payer: Self-pay | Admitting: Family Medicine

## 2018-01-26 DIAGNOSIS — R69 Illness, unspecified: Secondary | ICD-10-CM | POA: Diagnosis not present

## 2018-02-09 DIAGNOSIS — R69 Illness, unspecified: Secondary | ICD-10-CM | POA: Diagnosis not present

## 2018-04-18 ENCOUNTER — Other Ambulatory Visit: Payer: Self-pay | Admitting: Family Medicine

## 2018-04-18 DIAGNOSIS — E785 Hyperlipidemia, unspecified: Secondary | ICD-10-CM

## 2018-06-07 ENCOUNTER — Other Ambulatory Visit: Payer: Self-pay | Admitting: Family Medicine

## 2018-06-07 ENCOUNTER — Encounter: Payer: Self-pay | Admitting: Family Medicine

## 2018-06-07 ENCOUNTER — Ambulatory Visit (INDEPENDENT_AMBULATORY_CARE_PROVIDER_SITE_OTHER): Payer: Medicare HMO | Admitting: Family Medicine

## 2018-06-07 VITALS — BP 130/70 | HR 78 | Temp 98.4°F | Resp 12 | Ht 66.0 in | Wt 158.4 lb

## 2018-06-07 DIAGNOSIS — Z125 Encounter for screening for malignant neoplasm of prostate: Secondary | ICD-10-CM

## 2018-06-07 DIAGNOSIS — F411 Generalized anxiety disorder: Secondary | ICD-10-CM

## 2018-06-07 DIAGNOSIS — Z87891 Personal history of nicotine dependence: Secondary | ICD-10-CM

## 2018-06-07 DIAGNOSIS — Z0000 Encounter for general adult medical examination without abnormal findings: Secondary | ICD-10-CM

## 2018-06-07 DIAGNOSIS — Z136 Encounter for screening for cardiovascular disorders: Secondary | ICD-10-CM | POA: Diagnosis not present

## 2018-06-07 DIAGNOSIS — R413 Other amnesia: Secondary | ICD-10-CM

## 2018-06-07 DIAGNOSIS — R69 Illness, unspecified: Secondary | ICD-10-CM | POA: Diagnosis not present

## 2018-06-07 DIAGNOSIS — E785 Hyperlipidemia, unspecified: Secondary | ICD-10-CM

## 2018-06-07 LAB — COMPREHENSIVE METABOLIC PANEL
ALK PHOS: 53 U/L (ref 39–117)
ALT: 34 U/L (ref 0–53)
AST: 24 U/L (ref 0–37)
Albumin: 4.7 g/dL (ref 3.5–5.2)
BUN: 13 mg/dL (ref 6–23)
CO2: 30 mEq/L (ref 19–32)
Calcium: 10.2 mg/dL (ref 8.4–10.5)
Chloride: 101 mEq/L (ref 96–112)
Creatinine, Ser: 0.89 mg/dL (ref 0.40–1.50)
GFR: 89.82 mL/min (ref >60.00)
Glucose, Bld: 103 mg/dL — ABNORMAL HIGH (ref 70–99)
Potassium: 4.3 mEq/L (ref 3.5–5.1)
Sodium: 138 mEq/L (ref 135–145)
Total Bilirubin: 0.7 mg/dL (ref 0.2–1.2)
Total Protein: 7.4 g/dL (ref 6.0–8.3)

## 2018-06-07 LAB — LDL CHOLESTEROL, DIRECT: LDL DIRECT: 116 mg/dL

## 2018-06-07 LAB — LIPID PANEL
Cholesterol: 195 mg/dL (ref 0–200)
HDL: 51.4 mg/dL (ref >39.00)
NONHDL: 143.78
Total CHOL/HDL Ratio: 4
Triglycerides: 213 mg/dL — ABNORMAL HIGH (ref 0.0–149.0)
VLDL: 42.6 mg/dL — ABNORMAL HIGH (ref 0.0–40.0)

## 2018-06-07 LAB — PSA: PSA: 0.06 ng/mL — ABNORMAL LOW (ref 0.10–4.00)

## 2018-06-07 NOTE — Assessment & Plan Note (Signed)
Is stable with sertraline 50 mg daily. No changes in current management. Recommend considering psychotherapy as well. I think it is appropriate now to follow annually, before if needed.

## 2018-06-07 NOTE — Progress Notes (Signed)
HPI:  Randy Bean is a 68 y.o.male here today with his wife for his routine physical examination and Medicare preventive visit.  Last CPE: 03/2017. He lives with his wife.  Regular exercise 3 or more times per week: He works several times per week for about 30 minutes, almost daily. Following a healthy diet: Yes  He enjoys doing puzzles and working in his garden.    Chronic medical problems: Hyperlipidemia, former smoker, generalized anxiety disorder, and MCI.   Immunization History  Administered Date(s) Administered  . Hepatitis A 07/15/1994, 01/15/1995  . Hepatitis B 07/15/1994, 09/15/1994, 01/15/1995  . Influenza, High Dose Seasonal PF 03/31/2016  . Influenza-Unspecified 03/21/2017, 02/09/2018  . Meningococcal Conjugate 07/15/1994  . Pneumococcal Polysaccharide-23 04/01/2017  . Tdap 02/27/2007  . Zoster 02/12/2014  . Zoster Recombinat (Shingrix) 12/09/2017, 12/09/2017, 02/09/2018    -Hep C screening: 03/2017 NR   Last colon cancer screening: 03/2015 Last prostate ca screening: 03/2017 normal at 0.07. Negative for nocturia or urinary frequency.  -Denies high alcohol intake but his wife is reporting wine intake from 2 to 4 glasses daily. Former smoker.   -Concerns and/or follow up today:   Hyperlipidemia: Currently he is on simvastatin 20 mg daily and Zetia 10 mg daily. He has been consistent with a low fat diet. He is tolerating medication well, has not noted side effects.  Lab Results  Component Value Date   CHOL 173 04/01/2017   HDL 49.10 04/01/2017   LDLCALC 92 04/01/2017   TRIG 159.0 (H) 04/01/2017   CHOLHDL 4 04/01/2017   Medicare Preventive visit Last AWV 04/01/17   Functional Status Survey: Is the patient deaf or have difficulty hearing?: No Does the patient have difficulty seeing, even when wearing glasses/contacts?: Yes Does the patient have difficulty concentrating, remembering, or making decisions?: Yes Does the patient have  difficulty walking or climbing stairs?: No Does the patient have difficulty dressing or bathing?: No Does the patient have difficulty doing errands alone such as visiting a doctor's office or shopping?: No  Fall Risk  06/07/2018 04/01/2017  Falls in the past year? 0 No  Number falls in past yr: 0 -  Injury with Fall? 0 -  Follow up Education provided -     Providers he sees regularly:  Eye care provider: Has not had an eye exam in a few years,2015.  [facility]  Decreased Interest 0  Down, Depressed, Hopeless 0  PHQ - 2 Score 0    Mini-Cog - 06/07/18 0854    Normal clock drawing test?  yes    How many words correct?  1         Visual Acuity Screening   Right eye Left eye Both eyes  Without correction:     With correction: 20/50 20/25 20/25      Review of Systems  Constitutional: Negative for activity change, appetite change, fatigue and fever.  HENT: Negative for dental problem, nosebleeds, sore throat and trouble swallowing.   Eyes: Negative for redness and visual disturbance.  Respiratory: Negative for cough, shortness of breath and wheezing.   Cardiovascular: Negative for chest pain, palpitations and leg swelling.  Gastrointestinal: Negative for abdominal pain, blood in stool, nausea and vomiting.  Endocrine: Negative for cold intolerance, heat intolerance, polydipsia, polyphagia and polyuria.  Genitourinary: Negative for decreased urine volume, discharge, dysuria, genital sores, hematuria and testicular pain.  Musculoskeletal: Negative for gait problem and myalgias.  Skin: Negative for color change and rash.  Allergic/Immunologic:  Positive for environmental allergies.  Neurological: Negative for syncope, weakness and headaches.  Hematological: Negative for adenopathy. Does not bruise/bleed easily.  Psychiatric/Behavioral: Negative for confusion. The patient is nervous/anxious.   All other systems reviewed and are negative.    Current  Outpatient Medications on File Prior to Visit  Medication Sig Dispense Refill  . aspirin 325 MG tablet Take 325 mg by mouth daily.    . Coenzyme Q10 (COQ10) 100 MG CAPS Take 1 capsule by mouth daily.    . Flax OIL Take 1 tablet by mouth daily.    . Garlic 1000 MG CAPS Take 1 capsule by mouth daily.    Marland Kitchen loratadine (CLARITIN) 10 MG tablet Take 10 mg by mouth daily.    . Melatonin 5 MG TABS Take 1 tablet by mouth daily.    . Omega 3 1200 MG CAPS Take 1 capsule by mouth daily.    . simvastatin (ZOCOR) 20 MG tablet TAKE 1 TABLET BY MOUTH ONCE DAILY 90 tablet 2  . Turmeric 500 MG TABS Take 1 tablet by mouth daily.    . vitamin C (ASCORBIC ACID) 500 MG tablet Take 500 mg by mouth daily.    Marland Kitchen ZETIA 10 MG tablet Take 1 tablet (10 mg total) by mouth daily. 90 tablet 1  . Zoster Vaccine Adjuvanted [facility]) injection 0.5 ml in muscle and repeat in 8 weeks 0.5 mL 1   No current facility-administered medications on file prior to visit.      Past Medical History:  Diagnosis Date  . Allergy   . Anxiety   . Depression   . Hyperlipidemia     History reviewed. No pertinent surgical history.  Not on File  Family History  Problem Relation Age of Onset  . Mental illness Mother   . COPD Father     Social History   Socioeconomic History  . Marital status: Married    Spouse name: Not on file  . Number of children: Not on file  . Years of education: Not on file  . Highest education level: Not on file  Occupational History  . Not on file  Social Needs  . Financial resource strain: Not on file  . Food insecurity:    Worry: Not on file    Inability: Not on file  . Transportation needs:    Medical: Not on file    Non-medical: Not on file  Tobacco Use  . Smoking status: Former Smoker    Last attempt to quit: 05/24/1973    Years since quitting: 45.0  . Smokeless tobacco: Former Neurosurgeon    Types: Chew  Substance and Sexual Activity  . Alcohol use: Yes  . Drug use: No  . Sexual activity:  Yes  Lifestyle  . Physical activity:    Days per week: Not on file    Minutes per session: Not on file  . Stress: Not on file  Relationships  . Social connections:    Talks on phone: Not on file    Gets together: Not on file    Attends religious service: Not on file    Active member of club or organization: Not on file    Attends meetings of clubs or organizations: Not on file    Relationship status: Not on file  Other Topics Concern  . Not on file  Social History Narrative  . Not on file     Vitals:   06/07/18 0808  BP: 130/70  Pulse: 78  Resp: 12  Temp: 98.4 F (36.9 C)  SpO2: 98%   Body mass index is 25.56 kg/m.   Wt Readings from Last 3 Encounters:  06/07/18 158 lb 6 oz (71.8 kg)  09/30/17 159 lb (72.1 kg)  04/01/17 160 lb 8 oz (72.8 kg)      Physical Exam  Nursing note and vitals reviewed. Constitutional: He is oriented to person, place, and time. He appears well-developed and well-nourished. No distress.  HENT:  Head: Normocephalic and atraumatic.  Right Ear: Tympanic membrane, external ear and ear canal normal.  Left Ear: Tympanic membrane, external ear and ear canal normal.  Mouth/Throat: Oropharynx is clear and moist and mucous membranes are normal.  Eyes: Pupils are equal, round, and reactive to light. Conjunctivae and EOM are normal.  Neck: Normal range of motion. No tracheal deviation present. No thyromegaly present.  Cardiovascular: Normal rate and regular rhythm.  No murmur heard. Pulses:      Dorsalis pedis pulses are 2+ on the right side and 2+ on the left side.  Respiratory: Effort normal and breath sounds normal. No respiratory distress.  GI: Soft. He exhibits no mass. There is no hepatomegaly. There is no abdominal tenderness.  Genitourinary:    Genitourinary Comments: Refused,no concerns.   Musculoskeletal:        General: No tenderness or edema.     Comments: No major deformities appreciated and no signs of synovitis.    Lymphadenopathy:    He has no cervical adenopathy.       Right: No supraclavicular adenopathy present.       Left: No supraclavicular adenopathy present.  Neurological: He is alert and oriented to person, place, and time. He has normal strength. No cranial nerve deficit or sensory deficit. Coordination and gait normal.  Reflex Scores:      Bicep reflexes are 2+ on the right side and 2+ on the left side.      Patellar reflexes are 2+ on the right side and 2+ on the left side. Skin: Skin is warm. No erythema.  Psychiatric: He has a normal mood and affect.  Well groomed,good eye contact.     ASSESSMENT AND PLAN:  [name] was seen today for annual exam.  Orders Placed This Encounter  Procedures  . US Aorta  . Comprehensive metabolic panel  . PSA(Must document that pt has been informed of limitations of PSA testing.)  . Lipid panel  . LDL cholesterol, direct   Lab Results  Component Value Date   CHOL 195 06/07/2018   HDL 51.40 06/07/2018   LDLCALC 92 04/01/2017   LDLDIRECT 116.0 06/07/2018   TRIG 213.0 (H) 06/07/2018   CHOLHDL 4 06/07/2018   Lab Results  Component Value Date   ALT 34 06/07/2018   AST 24 06/07/2018   ALKPHOS 53 06/07/2018   BILITOT 0.7 06/07/2018   Lab Results  Component Value Date   CREATININE 0.89 06/07/2018   BUN 13 06/07/2018   NA 138 06/07/2018   K 4.3 06/07/2018   CL 101 06/07/2018   CO2 30 06/07/2018     Routine general medical examination at a health care facility  We discussed the importance of regular physical activity and healthy diet for prevention of chronic illness and/or complications. Preventive guidelines reviewed. Vaccination Tdap in 2008, recommend getting it at his pharmacy.  Next CPE in a year.  Medicare annual wellness visit, subsequent We discussed the importance of staying active, physically and mentally, as well as the benefits of a healthy/balnace diet. Low  impact exercise that involve stretching and strengthing  are ideal.  We discussed preventive screening for the next 5-10 years, summery of recommendations given in AVS:   Colonoscopy in 2026.  Glaucoma screening/eye exam every 1-2 years.  Flu vaccine annually.  Diabetes every 1-3 years and cholesterol annually.  Fall prevention   Abdominal aortic aneurysm screening if you ever smoke. Ordered today.  Advance directives and end of life discussed, Advance directives package given.    Former tobacco use -     US Aorta; Future  Screening for AAA (aortic abdominal aneurysm) -     US Aorta; Future  Prostate cancer screening -     PSA(Must document that pt has been informed of limitations of PSA testing.)   Memory difficulties Problem seems to be stable. Recommend cognitive challenging exercises, memory apps are available.  Hyperlipidemia No changes in current management, will follow labs done today and will give further recommendations accordingly.   Generalized anxiety disorder Is stable with sertraline 50 mg daily. No changes in current management. Recommend considering psychotherapy as well. I think it is appropriate now to follow annually, before if needed.      Return in 1 year (on 06/08/2019) for cpe.      Payslee Bateson G. [place], MD  [facility]. [place] office.

## 2018-06-07 NOTE — Patient Instructions (Addendum)
A few things to remember from today's visit:   Routine general medical examination at a health care facility  Hyperlipidemia, unspecified hyperlipidemia type - Plan: Comprehensive metabolic panel, Lipid panel  Former tobacco use - Plan: US Aorta  Screening for AAA (aortic abdominal aneurysm) - Plan: US Aorta  Prostate cancer screening - Plan: PSA(Must document that pt has been informed of limitations of PSA testing.)   A few tips:  -As we age balance is not as good as it was, so there is a higher risks for falls. Please remove small rugs and furniture that is "in your way" and could increase the risk of falls. Stretching exercises may help with fall prevention: Yoga and Tai Chi are some examples. Low impact exercise is better, so you are not very achy the next day.  -Sun screen and avoidance of direct sun light recommended. Caution with dehydration, if working outdoors be sure to drink enough fluids.  - Some medications are not safe as we age, increases the risk of side effects and can potentially interact with other medication you are also taken;  including some of over the counter medications. Be sure to let me know when you start a new medication even if it is a dietary/vitamin supplement.   -Healthy diet low in red meet/animal fat and sugar + regular physical activity is recommended.       Screening schedule for the next 5-10 years:  Colonoscopy in 2026.  Glaucoma screening/eye exam every 1-2 years.  Flu vaccine annually.  Diabetes every 1-3 years and cholesterol annually.  Fall prevention   Abdominal aortic aneurysm screening if you ever smoke. Ordered today.    Advance directives:  Please see a lawyer and/or go to this website to help you with advanced directives and designating a health care power of attorney so that your wishes will be followed should you become too ill to make your own medical decisions.  http://www.ncdhhs.gov/aging/direct.htm  Please be  sure medication list is accurate. If a new problem present, please set up appointment sooner than planned today.        

## 2018-06-07 NOTE — Assessment & Plan Note (Signed)
Problem seems to be stable. Recommend cognitive challenging exercises, memory apps are available.

## 2018-06-07 NOTE — Assessment & Plan Note (Signed)
No changes in current management, will follow labs done today and will give further recommendations accordingly.  

## 2018-06-08 ENCOUNTER — Encounter: Payer: Self-pay | Admitting: Family Medicine

## 2018-06-08 MED ORDER — ZETIA 10 MG PO TABS: 10.0000 mg | ORAL_TABLET | Freq: Every day | ORAL | 3 refills | Status: DC

## 2018-06-08 MED ORDER — SIMVASTATIN 20 MG PO TABS: 20.0000 mg | ORAL_TABLET | Freq: Every day | ORAL | 3 refills | Status: DC

## 2018-06-12 ENCOUNTER — Other Ambulatory Visit: Payer: Self-pay | Admitting: *Deleted

## 2018-06-12 MED ORDER — EZETIMIBE 10 MG PO TABS: 10.0000 mg | ORAL_TABLET | Freq: Every day | ORAL | 2 refills | Status: DC

## 2018-06-23 ENCOUNTER — Telehealth: Payer: Self-pay | Admitting: *Deleted

## 2018-06-23 NOTE — Telephone Encounter (Signed)
Prior auth for Zetia 10mg  sent to [email or web address]-key [id number].

## 2018-06-28 ENCOUNTER — Telehealth: Payer: Self-pay | Admitting: *Deleted

## 2018-06-28 NOTE — Telephone Encounter (Signed)
Fax received from [place] stating the request was approved from 05/22/2018-05/24/2019 and I called [facility].   I left a detailed message on the voicemail with this info.

## 2018-07-28 ENCOUNTER — Other Ambulatory Visit: Payer: Self-pay | Admitting: Family Medicine

## 2018-07-31 DIAGNOSIS — H524 Presbyopia: Secondary | ICD-10-CM | POA: Diagnosis not present

## 2018-11-04 ENCOUNTER — Other Ambulatory Visit: Payer: Self-pay | Admitting: Family Medicine

## 2018-11-09 ENCOUNTER — Other Ambulatory Visit: Payer: Self-pay | Admitting: Family Medicine

## 2019-01-30 DIAGNOSIS — R69 Illness, unspecified: Secondary | ICD-10-CM | POA: Diagnosis not present

## 2019-02-28 DIAGNOSIS — R69 Illness, unspecified: Secondary | ICD-10-CM | POA: Diagnosis not present

## 2019-04-15 ENCOUNTER — Other Ambulatory Visit: Payer: Self-pay | Admitting: Family Medicine

## 2019-04-15 DIAGNOSIS — F411 Generalized anxiety disorder: Secondary | ICD-10-CM

## 2019-06-13 ENCOUNTER — Telehealth: Payer: Self-pay | Admitting: Family Medicine

## 2019-06-13 NOTE — Telephone Encounter (Signed)
Left message for patient to call back and schedule Medicare Annual Wellness Visit (AWV) either virtually,audio only or in person (whichever the patient prefers--45 MINUTES).  Last AWV 1.15.21; please schedule at anytime with [facility]Nurse Health Advisor at [facility] at [place].  Ok for [facility] to schedule

## 2019-07-02 ENCOUNTER — Other Ambulatory Visit: Payer: Self-pay | Admitting: Family Medicine

## 2019-07-03 ENCOUNTER — Ambulatory Visit: Payer: Medicare HMO

## 2019-07-05 ENCOUNTER — Ambulatory Visit: Payer: Medicare HMO | Attending: Internal Medicine

## 2019-07-05 ENCOUNTER — Ambulatory Visit: Payer: Medicare HMO

## 2019-07-05 DIAGNOSIS — Z23 Encounter for immunization: Secondary | ICD-10-CM | POA: Insufficient documentation

## 2019-07-05 NOTE — Progress Notes (Signed)
   Covid-19 Vaccination Clinic  Name:  Randy Bean    MRN: [id number] DOB: 1951/05/22  07/05/2019  Randy Bean was observed post Covid-19 immunization for 15 minutes without incidence. He was provided with Vaccine Information Sheet and instruction to access the V-Safe system.   Randy Bean was instructed to call 911 with any severe reactions post vaccine: Marland Kitchen Difficulty breathing  . Swelling of your face and throat  . A fast heartbeat  . A bad rash all over your body  . Dizziness and weakness    Immunizations Administered    Name Date Dose VIS Date Route   Pfizer COVID-19 Vaccine 07/05/2019  1:46 PM 0.3 mL 05/04/2019 Intramuscular   Manufacturer: [employer], [employer]   Lot: [id number]   NDC: [address]-6

## 2019-07-09 ENCOUNTER — Other Ambulatory Visit: Payer: Self-pay

## 2019-07-09 ENCOUNTER — Encounter: Payer: Self-pay | Admitting: Neurology

## 2019-07-09 ENCOUNTER — Ambulatory Visit (INDEPENDENT_AMBULATORY_CARE_PROVIDER_SITE_OTHER): Payer: Medicare HMO | Admitting: Family Medicine

## 2019-07-09 ENCOUNTER — Encounter: Payer: Self-pay | Admitting: Family Medicine

## 2019-07-09 VITALS — BP 120/70 | HR 86 | Temp 96.0°F | Resp 16 | Ht 66.0 in | Wt 166.0 lb

## 2019-07-09 DIAGNOSIS — R69 Illness, unspecified: Secondary | ICD-10-CM | POA: Diagnosis not present

## 2019-07-09 DIAGNOSIS — Z0000 Encounter for general adult medical examination without abnormal findings: Secondary | ICD-10-CM | POA: Diagnosis not present

## 2019-07-09 DIAGNOSIS — R413 Other amnesia: Secondary | ICD-10-CM

## 2019-07-09 DIAGNOSIS — F411 Generalized anxiety disorder: Secondary | ICD-10-CM

## 2019-07-09 DIAGNOSIS — E785 Hyperlipidemia, unspecified: Secondary | ICD-10-CM

## 2019-07-09 NOTE — Patient Instructions (Signed)
  [name] , Thank you for taking time to come for your Medicare Wellness Visit. I appreciate your ongoing commitment to your health goals. Please review the following plan we discussed and let me know if I can assist you in the future.   These are the goals we discussed: Goals   None     This is a list of the screening recommended for you and due dates:  Health Maintenance  Topic Date Due  . Tetanus Vaccine  05/23/2020*  . Colon Cancer Screening  05/24/2024  . Flu Shot  Completed  .  Hepatitis C: One time screening is recommended by Center for Disease Control  (CDC) for  adults born from [birth year] through 1967.   Completed  . Pneumonia vaccines  Completed  *Topic was postponed. The date shown is not the original due date.    A few tips:  -As we age balance is not as good as it was, so there is a higher risks for falls. Please remove small rugs and furniture that is "in your way" and could increase the risk of falls. Stretching exercises may help with fall prevention: Yoga and Tai Chi are some examples. Low impact exercise is better, so you are not very achy the next day.  -Sun screen and avoidance of direct sun light recommended. Caution with dehydration, if working outdoors be sure to drink enough fluids.  - Some medications are not safe as we age, increases the risk of side effects and can potentially interact with other medication you are also taken;  including some of over the counter medications. Be sure to let me know when you start a new medication even if it is a dietary/vitamin supplement.   -Healthy diet low in red meet/animal fat and sugar + regular physical activity is recommended.     Advance directives:  Please see a lawyer and/or go to this website to help you with advanced directives and designating a health care power of attorney so that your wishes will be followed should you become too ill to make your own medical  decisions.  [email or web address]

## 2019-07-09 NOTE — Progress Notes (Addendum)
HPI:  Randy Bean is a 69 y.o.male here today with his wife for his routine physical examination and AWV.  Last CPE: 06/07/18.  He lives with his wife.. Independent ADL's and IADL's. No falls in the past year and denies depression symptoms.  Functional Status Survey: Is the patient deaf or have difficulty hearing?: No Does the patient have difficulty seeing, even when wearing glasses/contacts?: No Does the patient have difficulty concentrating, remembering, or making decisions?: No Does the patient have difficulty walking or climbing stairs?: No Does the patient have difficulty dressing or bathing?: No Does the patient have difficulty doing errands alone such as visiting a doctor's office or shopping?: No  Fall Risk  07/09/2019 06/07/2018 04/01/2017  Falls in the past year? 0 0 No  Number falls in past yr: 0 0 -  Injury with Fall? 0 0 -  Follow up Education provided Education provided -   Providers he sees regularly: Eye care provider: Dr Rock Nephew, last seen in 2020.  Depression screen [facility] 2/9 07/09/2019  Decreased Interest 0  Down, Depressed, Hopeless 0  PHQ - 2 Score 0    Mini-Cog - 07/09/19 0815    Normal clock drawing test?  yes    How many words correct?  3        Hearing Screening   125Hz  250Hz  500Hz  1000Hz  2000Hz  3000Hz  4000Hz  6000Hz  8000Hz   Right ear:           Left ear:             Visual Acuity Screening   Right eye Left eye Both eyes  Without correction:     With correction: 20/25 20/25 20/25     Regular exercise 3 or more times per week: He tries to walk daily if weather permits it. Following a healthy diet: Not consistently. He eating ice cream daily.  Chronic medical problems: Anxiety,HLD,and memory difficulties.   Immunization History  Administered Date(s) Administered  . Hepatitis A 07/15/1994, 01/15/1995  . Hepatitis B 07/15/1994, 09/15/1994, 01/15/1995  . Influenza, High Dose Seasonal PF 03/31/2016, 02/09/2018, 01/30/2019  .  Influenza-Unspecified 03/21/2017, 02/09/2018  . Meningococcal Conjugate 07/15/1994  . PFIZER SARS-COV-2 Vaccination 07/05/2019  . Pneumococcal Conjugate-13 02/28/2019  . Pneumococcal Polysaccharide-23 04/01/2017  . Tdap 02/27/2007  . Zoster 02/12/2014  . Zoster Recombinat (Shingrix) 12/09/2017, 12/09/2017, 02/09/2018   He received his first Covid vaccine 07/05/19.  -Hep C screening: 03/2017.  Last colon cancer screening: 04/04/2015. Last prostate ca screening: 06/07/18 PSA was 0.06. Nocturia x 0. Negative for increased in urinary frequency or urine dribbling.   -Negative for tobacco use, or Hx of illicit drug use. He drinks wine about 2 glasses nightly.  -Concerns and/or follow up today:   Anxiety: He is on Sertraline 50 mg daily. Problem seems to be stable in general. He does not think he needs medication but his wife has noted improvement since he started medication.  He has had intermittent episodes of forgetfulness for 2-3 years, it is gradually getting worse. Negative for frequent/severe headache,MS changes,or focal neurologic deficit.  Lab Results  Component Value Date   [id number] 098 04/09/2016   Lab Results  Component Value Date   TSH 0.80 04/09/2016   HLD: He is on Simvastatin 20 mg and Zetia 10 mg daily. Tolerating medications well.  Lab Results  Component Value Date   CHOL 195 06/07/2018   HDL 51.40 06/07/2018   LDLCALC 92 04/01/2017   LDLDIRECT 116.0 06/07/2018   TRIG 213.0 (H) 06/07/2018  CHOLHDL 4 06/07/2018    Review of Systems  Constitutional: Negative for activity change, appetite change, fatigue and fever.  HENT: Negative for dental problem, mouth sores, nosebleeds and sore throat.   Eyes: Negative for redness and visual disturbance.  Respiratory: Negative for cough, shortness of breath and wheezing.   Cardiovascular: Negative for chest pain, palpitations and leg swelling.  Gastrointestinal: Negative for abdominal pain, blood in stool, nausea  and vomiting.  Endocrine: Negative for cold intolerance, heat intolerance, polydipsia, polyphagia and polyuria.  Genitourinary: Negative for decreased urine volume, dysuria, genital sores, hematuria and testicular pain.  Musculoskeletal: Negative for gait problem and myalgias.  Skin: Negative for color change and rash.  Allergic/Immunologic: Positive for environmental allergies.  Neurological: Negative for syncope, weakness and headaches.  Hematological: Negative for adenopathy. Does not bruise/bleed easily.  Psychiatric/Behavioral: Negative for confusion and sleep disturbance. The patient is not nervous/anxious.   All other systems reviewed and are negative.   Current Outpatient Medications on File Prior to Visit  Medication Sig Dispense Refill  . aspirin 325 MG tablet Take 325 mg by mouth daily.    . Coenzyme Q10 (COQ10) 100 MG CAPS Take 1 capsule by mouth daily.    . Flax OIL Take 1 tablet by mouth daily.    . Garlic 1000 MG CAPS Take 1 capsule by mouth daily.    Marland Kitchen loratadine (CLARITIN) 10 MG tablet Take 10 mg by mouth daily.    . Melatonin 5 MG TABS Take 1 tablet by mouth daily.    . Omega 3 1200 MG CAPS Take 1 capsule by mouth daily.    . Turmeric 500 MG TABS Take 1 tablet by mouth daily.    . vitamin C (ASCORBIC ACID) 500 MG tablet Take 500 mg by mouth daily.     No current facility-administered medications on file prior to visit.   Past Medical History:  Diagnosis Date  . Allergy   . Anxiety   . Depression   . Hyperlipidemia     History reviewed. No pertinent surgical history.  No Known Allergies  Family History  Problem Relation Age of Onset  . Mental illness Mother   . COPD Father     Social History   Socioeconomic History  . Marital status: Married    Spouse name: Not on file  . Number of children: Not on file  . Years of education: Not on file  . Highest education level: Not on file  Occupational History  . Not on file  Tobacco Use  . Smoking status:  Former Smoker    Quit date: 05/24/1973    Years since quitting: 46.1  . Smokeless tobacco: Former Neurosurgeon    Types: Chew  Substance and Sexual Activity  . Alcohol use: Yes  . Drug use: No  . Sexual activity: Yes  Other Topics Concern  . Not on file  Social History Narrative  . Not on file   Social Determinants of Health   Financial Resource Strain:   . Difficulty of Paying Living Expenses: Not on file  Food Insecurity:   . Worried About Programme researcher, broadcasting/film/video in the Last Year: Not on file  . Ran Out of Food in the Last Year: Not on file  Transportation Needs:   . Lack of Transportation (Medical): Not on file  . Lack of Transportation (Non-Medical): Not on file  Physical Activity:   . Days of Exercise per Week: Not on file  . Minutes of Exercise per Session: Not  on file  Stress:   . Feeling of Stress : Not on file  Social Connections:   . Frequency of Communication with Friends and Family: Not on file  . Frequency of Social Gatherings with Friends and Family: Not on file  . Attends Religious Services: Not on file  . Active Member of Clubs or Organizations: Not on file  . Attends Banker Meetings: Not on file  . Marital Status: Not on file    Vitals:   07/09/19 0747  BP: 120/70  Pulse: 86  Resp: 16  Temp: (!) 96 F (35.6 C)  SpO2: 97%   Body mass index is 26.79 kg/m.   Wt Readings from Last 3 Encounters:  07/09/19 166 lb (75.3 kg)  06/07/18 158 lb 6 oz (71.8 kg)  09/30/17 159 lb (72.1 kg)    Physical Exam  Nursing note and vitals reviewed. Constitutional: He appears well-developed. No distress.  HENT:  Head: Normocephalic and atraumatic.  Right Ear: Tympanic membrane, external ear and ear canal normal.  Left Ear: Tympanic membrane, external ear and ear canal normal.  Mouth/Throat: Oropharynx is clear and moist and mucous membranes are normal.  Eyes: Pupils are equal, round, and reactive to light. Conjunctivae and EOM are normal.  Neck: No tracheal  deviation present. No thyromegaly present.  Cardiovascular: Normal rate and regular rhythm.  No murmur heard. Pulses:      Dorsalis pedis pulses are 2+ on the right side and 2+ on the left side.  Respiratory: Effort normal and breath sounds normal. No respiratory distress.  GI: Soft. He exhibits no mass. There is no hepatomegaly. There is no abdominal tenderness.  Genitourinary:    Genitourinary Comments: no concerns.   Musculoskeletal:        General: No tenderness or edema.     Cervical back: Normal range of motion.     Comments: No major deformities appreciated and no signs of synovitis.  Lymphadenopathy:    He has no cervical adenopathy.       Right: No supraclavicular adenopathy present.       Left: No supraclavicular adenopathy present.  Neurological: He is alert. He has normal strength. No cranial nerve deficit or sensory deficit. Gait normal.  Reflex Scores:      Bicep reflexes are 2+ on the right side and 2+ on the left side.      Patellar reflexes are 2+ on the right side and 2+ on the left side. Does not remember date, "2018" Oriented in place and person.  Skin: Skin is warm. No erythema.  Psychiatric: He has a normal mood and affect.  Well groomed, good eye contact.    ASSESSMENT AND PLAN:  Mr. Matisse was seen today for annual exam and medicare wellness.  Diagnoses and all orders for this visit:  Lab Results  Component Value Date   CHOL 202 (H) 07/16/2019   HDL 58.90 07/16/2019   LDLCALC 92 04/01/2017   LDLDIRECT 113.0 07/16/2019   TRIG 207.0 (H) 07/16/2019   CHOLHDL 3 07/16/2019   Lab Results  Component Value Date   CREATININE 0.90 07/16/2019   BUN 16 07/16/2019   NA 139 07/16/2019   K 4.4 07/16/2019   CL 102 07/16/2019   CO2 29 07/16/2019   Lab Results  Component Value Date   ALT 55 (H) 07/16/2019   AST 34 07/16/2019   ALKPHOS 55 07/16/2019   BILITOT 0.7 07/16/2019    Routine general medical examination at a health care facility We  discussed  the importance of regular physical activity and healthy diet for prevention of chronic illness and/or complications. Preventive guidelines reviewed. Vaccination : Because he received his COVID 19 vaccine 4 days ago, recommend pneumovax 2-3 weeks after 2nd dose.  Next CPE in a year.  Medicare annual wellness visit, subsequent We discussed the importance of staying active, physically and mentally, as well as the benefits of a healthy/balance diet. Low impact exercise that involve stretching and strengthing are ideal.  We discussed preventive screening for the next 5-10 years, summery of recommendations given in AVS. He can arrange nurse visit to get pneumovax, 2-3 weeks after his 2nd COVID 19 vaccine. Fall prevention.  Advance directives and end of life discussed, he is working on this. Advance health directive package given.   Hyperlipidemia, unspecified hyperlipidemia type Continue Zetia 10 mg and Simvastatin 20 mg daily. Treatment will be adjusted if needed and according to lab results. -     Lipid panel; Future -     Comprehensive metabolic panel; Future  Generalized anxiety disorder Problem seems to be stable. Continue Sertraline 50 mg daily.  Memory difficulties ? Alzheimer's disease. Other possible etiologies discussed,including depression.  Neuro referral placed.  -     Ambulatory referral to Neurology  Today we do not have lab service. [facility] lab offered, he prefers to come back later this week for blood work.  Return in 1 year (on 07/08/2020) for cpe.    Iysis Germain G. [place], MD  [facility]. [place] office.

## 2019-07-10 DIAGNOSIS — R69 Illness, unspecified: Secondary | ICD-10-CM | POA: Diagnosis not present

## 2019-07-12 ENCOUNTER — Encounter: Payer: Self-pay | Admitting: Family Medicine

## 2019-07-12 MED ORDER — SIMVASTATIN 20 MG PO TABS: 20.0000 mg | ORAL_TABLET | Freq: Every day | ORAL | 2 refills | Status: DC

## 2019-07-12 MED ORDER — EZETIMIBE 10 MG PO TABS: 10.0000 mg | ORAL_TABLET | Freq: Every day | ORAL | 2 refills | Status: DC

## 2019-07-12 MED ORDER — SERTRALINE HCL 50 MG PO TABS: 50.0000 mg | ORAL_TABLET | Freq: Every day | ORAL | 3 refills | Status: DC

## 2019-07-16 ENCOUNTER — Other Ambulatory Visit (INDEPENDENT_AMBULATORY_CARE_PROVIDER_SITE_OTHER): Payer: Medicare HMO

## 2019-07-16 ENCOUNTER — Other Ambulatory Visit: Payer: Self-pay

## 2019-07-16 DIAGNOSIS — E785 Hyperlipidemia, unspecified: Secondary | ICD-10-CM

## 2019-07-16 LAB — COMPREHENSIVE METABOLIC PANEL
ALT: 55 U/L — ABNORMAL HIGH (ref 0–53)
AST: 34 U/L (ref 0–37)
Albumin: 4.7 g/dL (ref 3.5–5.2)
Alkaline Phosphatase: 55 U/L (ref 39–117)
BUN: 16 mg/dL (ref 6–23)
CO2: 29 mEq/L (ref 19–32)
Calcium: 9.9 mg/dL (ref 8.4–10.5)
Chloride: 102 mEq/L (ref 96–112)
Creatinine, Ser: 0.9 mg/dL (ref 0.40–1.50)
GFR: 83.16 mL/min (ref >60.00)
Glucose, Bld: 102 mg/dL — ABNORMAL HIGH (ref 70–99)
Potassium: 4.4 mEq/L (ref 3.5–5.1)
Sodium: 139 mEq/L (ref 135–145)
Total Bilirubin: 0.7 mg/dL (ref 0.2–1.2)
Total Protein: 7.1 g/dL (ref 6.0–8.3)

## 2019-07-16 LAB — LDL CHOLESTEROL, DIRECT: Direct LDL: 113 mg/dL

## 2019-07-16 LAB — LIPID PANEL
Cholesterol: 202 mg/dL — ABNORMAL HIGH (ref 0–200)
HDL: 58.9 mg/dL (ref >39.00)
NonHDL: 142.7
Total CHOL/HDL Ratio: 3
Triglycerides: 207 mg/dL — ABNORMAL HIGH (ref 0.0–149.0)
VLDL: 41.4 mg/dL — ABNORMAL HIGH (ref 0.0–40.0)

## 2019-07-18 DIAGNOSIS — R69 Illness, unspecified: Secondary | ICD-10-CM | POA: Diagnosis not present

## 2019-07-22 ENCOUNTER — Encounter: Payer: Self-pay | Admitting: Family Medicine

## 2019-07-28 ENCOUNTER — Ambulatory Visit: Payer: Medicare HMO | Attending: Internal Medicine

## 2019-07-28 DIAGNOSIS — Z23 Encounter for immunization: Secondary | ICD-10-CM | POA: Insufficient documentation

## 2019-07-28 NOTE — Progress Notes (Signed)
   Covid-19 Vaccination Clinic  Name:  Randy Bean    MRN: [id number] DOB: 1951-03-03  07/28/2019  [name] was observed post Covid-19 immunization for 15 minutes without incident. He was provided with Vaccine Information Sheet and instruction to access the V-Safe system.   [name] was instructed to call 911 with any severe reactions post vaccine: Marland Kitchen Difficulty breathing  . Swelling of face and throat  . A fast heartbeat  . A bad rash all over body  . Dizziness and weakness   Immunizations Administered    Name Date Dose VIS Date Route   Pfizer COVID-19 Vaccine 07/28/2019 12:41 PM 0.3 mL 05/04/2019 Intramuscular   Manufacturer: [employer], [employer]   Lot: [id number]   NDC: [address]-9

## 2019-08-08 ENCOUNTER — Other Ambulatory Visit: Payer: Self-pay | Admitting: Family Medicine

## 2019-08-08 DIAGNOSIS — E785 Hyperlipidemia, unspecified: Secondary | ICD-10-CM

## 2019-09-24 ENCOUNTER — Other Ambulatory Visit: Payer: Self-pay | Admitting: Family Medicine

## 2019-10-05 ENCOUNTER — Ambulatory Visit: Payer: Medicare HMO | Admitting: Neurology

## 2019-10-05 DIAGNOSIS — H524 Presbyopia: Secondary | ICD-10-CM | POA: Diagnosis not present

## 2019-10-05 DIAGNOSIS — Z0100 Encounter for examination of eyes and vision without abnormal findings: Secondary | ICD-10-CM | POA: Diagnosis not present

## 2019-10-08 ENCOUNTER — Other Ambulatory Visit: Payer: Self-pay

## 2019-10-08 ENCOUNTER — Encounter: Payer: Self-pay | Admitting: Neurology

## 2019-10-08 ENCOUNTER — Other Ambulatory Visit (INDEPENDENT_AMBULATORY_CARE_PROVIDER_SITE_OTHER): Payer: Medicare HMO

## 2019-10-08 ENCOUNTER — Ambulatory Visit: Payer: Medicare HMO | Admitting: Neurology

## 2019-10-08 VITALS — BP 160/80 | HR 97 | Resp 18 | Ht 66.5 in | Wt 165.0 lb

## 2019-10-08 DIAGNOSIS — G3184 Mild cognitive impairment, so stated: Secondary | ICD-10-CM

## 2019-10-08 DIAGNOSIS — R413 Other amnesia: Secondary | ICD-10-CM

## 2019-10-08 LAB — HEPATIC FUNCTION PANEL
ALT: 45 U/L (ref 0–53)
AST: 36 U/L (ref 0–37)
Albumin: 4.7 g/dL (ref 3.5–5.2)
Alkaline Phosphatase: 59 U/L (ref 39–117)
Bilirubin, Direct: 0.1 mg/dL (ref 0.0–0.3)
Total Bilirubin: 0.5 mg/dL (ref 0.2–1.2)
Total Protein: 7.4 g/dL (ref 6.0–8.3)

## 2019-10-08 LAB — VITAMIN B12: Vitamin B-12: 438 pg/mL (ref 211–911)

## 2019-10-08 LAB — TSH: TSH: 1.28 u[IU]/mL (ref 0.35–4.50)
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
## 2019-10-08 NOTE — Patient Instructions (Addendum)
1. Bloodwork for TSH, B12, RPR, LFTs, BUN, creatinine  2. Schedule MRI brain with and without contrast  3. Schedule Neurocognitive testing  4. Start Aricept (Donepezil) 10mg : take 1/2 tablet daily for 2 weeks, then increase to 1 tablet daily  5. Follow-up in 6 months, call for any changes  FALL PRECAUTIONS: Be cautious when walking. Scan the area for obstacles that may increase the risk of trips and falls. When getting up in the mornings, sit up at the edge of the bed for a few minutes before getting out of bed. Consider elevating the bed at the head end to avoid drop of blood pressure when getting up. Walk always in a well-lit room (use night lights in the walls). Avoid area rugs or power cords from appliances in the middle of the walkways. Use a walker or a cane if necessary and consider physical therapy for balance exercise. Get your eyesight checked regularly.  HOME SAFETY: Consider the safety of the kitchen when operating appliances like stoves, microwave oven, and blender. Consider having supervision and share cooking responsibilities until no longer able to participate in those. Accidents with firearms and other hazards in the house should be identified and addressed as well.  DRIVING: Regarding driving, in patients with progressive memory problems, driving will be impaired. We advise to have someone else do the driving if trouble finding directions or if minor accidents are reported. Independent driving assessment is available to determine safety of driving.  ABILITY TO BE LEFT ALONE: If patient is unable to contact 911 operator, consider using LifeLine, or when the need is there, arrange for someone to stay with patients. Smoking is a fire hazard, consider supervision or cessation. Risk of wandering should be assessed by caregiver and if detected at any point, supervision and safe proof recommendations should be instituted.  MEDICATION SUPERVISION: Inability to self-administer medication  needs to be constantly addressed. Implement a mechanism to ensure safe administration of the medications.  RECOMMENDATIONS FOR ALL PATIENTS WITH MEMORY PROBLEMS: 1. Continue to exercise (Recommend 30 minutes of walking everyday, or 3 hours every week) 2. Increase social interactions - continue going to [place] and enjoy social gatherings with friends and family 3. Eat healthy, avoid fried foods and eat more fruits and vegetables 4. Maintain adequate blood pressure, blood sugar, and blood cholesterol level. Reducing the risk of stroke and cardiovascular disease also helps promoting better memory. 5. Avoid stressful situations. Live a simple life and avoid aggravations. Organize your time and prepare for the next day in anticipation. 6. Sleep well, avoid any interruptions of sleep and avoid any distractions in the bedroom that may interfere with adequate sleep quality 7. Avoid sugar, avoid sweets as there is a strong link between excessive sugar intake, diabetes, and cognitive impairment We discussed the Mediterranean diet, which has been shown to help patients reduce the risk of progressive memory disorders and reduces cardiovascular risk. This includes eating fish, eat fruits and green leafy vegetables, nuts like almonds and hazelnuts, walnuts, and also use olive oil. Avoid fast foods and fried foods as much as possible. Avoid sweets and sugar as sugar use has been linked to worsening of memory function.  There is always a concern of gradual progression of memory problems. If this is the case, then we may need to adjust level of care according to patient needs. Support, both to the patient and caregiver, should then be put into place.   Your provider has requested that you have labwork completed today. Please go  to [facility] ([phone]) on the second floor of this building before leaving the office today. You do not need to check in. If you are not called within 15 minutes please check with  the front desk.  We have sent a referral to [facility] for your MRI and they will call you directly to schedule your appointment. They are located at [address] [facility]. If you need to contact them directly please call [phone].

## 2019-10-09 LAB — BUN+CREAT
BUN/Creatinine Ratio: 16 (ref 10–24)
BUN: 13 mg/dL (ref 8–27)
Creatinine, Ser: 0.81 mg/dL (ref 0.76–1.27)
GFR calc Af Amer: 103 mL/min/{1.73_m2} (ref >59)
GFR calc non Af Amer: 89 mL/min/{1.73_m2} (ref >59)

## 2019-10-09 LAB — RPR: RPR Ser Ql: NONREACTIVE

## 2019-10-19 ENCOUNTER — Telehealth: Payer: Self-pay | Admitting: Neurology

## 2019-10-19 MED ORDER — DONEPEZIL HCL 10 MG PO TABS
ORAL_TABLET | ORAL | 11 refills | Status: DC
Start: 2019-10-19 — End: 2019-11-12

## 2019-10-19 NOTE — Telephone Encounter (Signed)
Rx sent to pharmacy, thanks!

## 2019-10-19 NOTE — Telephone Encounter (Signed)
Patient's wife called in and stated the patient was supposed to start a new medication "Aerosep" but that it hadn't been sent to the pharmacy yet.

## 2019-10-23 ENCOUNTER — Encounter: Payer: Medicare HMO | Admitting: Psychology

## 2019-11-07 ENCOUNTER — Other Ambulatory Visit: Payer: Self-pay

## 2019-11-07 ENCOUNTER — Ambulatory Visit
Admission: RE | Admit: 2019-11-07 | Discharge: 2019-11-07 | Disposition: A | Discharge status: Home / Self Care | Payer: Medicare HMO | Source: Ambulatory Visit | Attending: Neurology | Admitting: Neurology

## 2019-11-07 DIAGNOSIS — R413 Other amnesia: Secondary | ICD-10-CM | POA: Diagnosis not present

## 2019-11-07 DIAGNOSIS — H538 Other visual disturbances: Secondary | ICD-10-CM | POA: Diagnosis not present

## 2019-11-07 MED ORDER — GADOBENATE DIMEGLUMINE 529 MG/ML IV SOLN
15.0000 mL | Freq: Once | INTRAVENOUS | Status: AC | PRN
  Administered 2019-11-07: 15 mL via INTRAVENOUS

## 2019-11-12 ENCOUNTER — Ambulatory Visit (INDEPENDENT_AMBULATORY_CARE_PROVIDER_SITE_OTHER): Payer: Medicare HMO | Admitting: Psychology

## 2019-11-12 ENCOUNTER — Ambulatory Visit: Payer: Medicare HMO | Admitting: Psychology

## 2019-11-12 ENCOUNTER — Encounter: Payer: Self-pay | Admitting: Psychology

## 2019-11-12 ENCOUNTER — Telehealth: Payer: Self-pay | Admitting: Neurology

## 2019-11-12 ENCOUNTER — Other Ambulatory Visit: Payer: Self-pay

## 2019-11-12 DIAGNOSIS — G3184 Mild cognitive impairment, so stated: Secondary | ICD-10-CM

## 2019-11-12 DIAGNOSIS — Z87828 Personal history of other (healed) physical injury and trauma: Secondary | ICD-10-CM | POA: Insufficient documentation

## 2019-11-12 DIAGNOSIS — R4189 Other symptoms and signs involving cognitive functions and awareness: Secondary | ICD-10-CM

## 2019-11-12 HISTORY — DX: Mild cognitive impairment of uncertain or unknown etiology: G31.84

## 2019-11-12 MED ORDER — DONEPEZIL HCL 10 MG PO TABS
ORAL_TABLET | ORAL | 11 refills | Status: DC
Start: 2019-11-12 — End: 2020-12-12

## 2019-11-12 MED ORDER — DONEPEZIL HCL 10 MG PO TABS
ORAL_TABLET | ORAL | 11 refills | Status: DC
Start: 2019-11-12 — End: 2019-11-12

## 2019-11-12 NOTE — Progress Notes (Signed)
   Psychometrician Note   Cognitive testing was administered to [name] by Wallace Keller, B.S. (psychometrist) under the supervision of Dr. Newman Nickels, Ph.D., licensed psychologist on 11/12/19. Mr. Bellamy did not appear overtly distressed by the testing session per behavioral observation or responses across self-report questionnaires. Dr. Newman Nickels, Ph.D. checked in with Mr. Hands as needed to manage any distress related to testing procedures (if applicable). Rest breaks were offered.    The battery of tests administered was selected by Dr. Newman Nickels, Ph.D. with consideration to Mr. Wiedemann current level of functioning, the nature of his symptoms, emotional and behavioral responses during interview, level of literacy, observed level of motivation/effort, and the nature of the referral question. This battery was communicated to the psychometrist. Communication between Dr. Newman Nickels, Ph.D. and the psychometrist was ongoing throughout the evaluation and Dr. Newman Nickels, Ph.D. was immediately accessible at all times. Dr. Newman Nickels, Ph.D. provided supervision to the psychometrist on the date of this service to the extent necessary to assure the quality of all services provided.    Jarquavious Fentress will return within approximately 1-2 weeks for an interactive feedback session with Dr. Milbert Coulter at which time his test performances, clinical impressions, and treatment recommendations will be reviewed in detail. Mr. Markowicz understands he can contact our office should he require our assistance before this time.  A total of 165 minutes of billable time were spent face-to-face with Mr. Milles by the psychometrist. This includes both test administration and scoring time. Billing for these services is reflected in the clinical report generated by Dr. Newman Nickels, Ph.D..  This note reflects time spent with the psychometrician and does not include test scores or any clinical  interpretations made by Dr. Milbert Coulter. The full report will follow in a separate note.

## 2019-11-12 NOTE — Telephone Encounter (Signed)
Pls let wife know I sent it to the [facility] in [place] last 5/28, we will resend again today. I'll send e-script and we can also fax, thanks

## 2019-11-12 NOTE — Patient Instructions (Signed)
Clinical Impression(s): Mr. Faller' pattern of performance is suggestive of prominent deficits in learning and memory, executive functioning, and phonemic fluency. Performance variability was additionally exhibited across processing speed and visuospatial/constructional abilities. Performance was appropriate across domains of attention/concentration, receptive language, semantic fluency, and confrontation naming. Mr. Minihan denied difficulties completing instrumental activities of daily living (ADLs) independently. However, his wife alluded to occasional difficulties with medication management. As such, given evidence for cognitive dysfunction described above, he meets criteria for a Mild Neurocognitive Disorder (formerly "mild cognitive impairment"). However, he may be trending towards the more moderate-severe end of this diagnostic spectrum currently.   The etiology for ongoing cognitive deficits is unclear. Profound verbal memory impairment with very poor retrieval, recognition, and retention percentages is certainly concerning for Alzheimer's disease and this condition should remain on his differential. Deficits in executive and visuospatial functioning would fall in line with this presentation. However, not all cognitive performances were consistent with this presentation. This is especially true surrounding intact semantic fluency and confrontation naming scores, which were unexpected relative to the extent of memory impairment. While less likely, Lewy body dementia should remain on the differential given deficits in visuospatial and executive abilities. However, memory deficits appear more significant than what would be expected in earlier stages of this illness and Mr. Hazan and his wife did not report common behavioral characteristics of this condition (e.g., visual hallucinations, REM sleep disorder, fluctuations in alertness, or motor abnormalities). Several aspects of expressive language  represented a relative strength across the current evaluation and neuroimaging did not suggest frontal/temporal atrophy, making a frontotemporal dementia presentation unlikely. Imaging also did not suggest small vessel ischemia or other vascular factors, making a vascular contribution less likely. Continued medical monitoring will be important moving forward.

## 2019-11-12 NOTE — Telephone Encounter (Signed)
Called pt wife back no answer left a voice mail that Dr. Karel Jarvis  sent it to the [facility] in [place] last 5/28, we will resend again today. Dr Karel Jarvis will  send e-script and we can also fax script to [facility]

## 2019-11-12 NOTE — Progress Notes (Signed)
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 Questionnaire: 9 --- None to Slight   Informed Consent and Coding/Compliance:   Mr. Dalton was provided with a verbal description of the nature and purpose of the present neuropsychological evaluation. Also reviewed were the foreseeable risks and/or discomforts and benefits of the procedure, limits of confidentiality, and mandatory reporting requirements of this provider. The patient was given the opportunity to ask questions and receive answers about the evaluation. Oral consent to participate was provided by the patient.   This evaluation was conducted by Newman Nickels, Ph.D., licensed clinical neuropsychologist. Mr. Oldaker completed a comprehensive clinical interview with Dr. Milbert Coulter, billed as one unit [phone], and 165 minutes of cognitive testing and scoring, billed as one unit [phone] and five additional units [id number]. Psychometrist Wallace Keller, B.S., assisted Dr. Milbert Coulter with test administration and scoring procedures. As a separate and discrete service, Dr. Milbert Coulter spent a total of 180 minutes in interpretation and report writing billed as one unit [phone] and two units [id number].

## 2019-11-12 NOTE — Telephone Encounter (Signed)
Patient's wife reported the pharmacy still has not received the patient's prescription for Aricept for the patient to start.  [facility] in [place]

## 2019-11-19 ENCOUNTER — Ambulatory Visit (INDEPENDENT_AMBULATORY_CARE_PROVIDER_SITE_OTHER): Payer: Medicare HMO | Admitting: Psychology

## 2019-11-19 ENCOUNTER — Other Ambulatory Visit: Payer: Self-pay

## 2019-11-19 DIAGNOSIS — G3184 Mild cognitive impairment, so stated: Secondary | ICD-10-CM | POA: Diagnosis not present

## 2019-11-19 NOTE — Patient Instructions (Signed)
Recommendations: A repeat neuropsychological evaluation in 12-18 months (or sooner if functional decline is noted) is recommended to assess the trajectory of future cognitive decline should it occur. This will also aid in future efforts towards improved diagnostic clarity.  Mr. Hilburn was recently prescribed donepezil/Aricept by Dr. Karel Jarvis. He is encouraged to take this medication as prescribed as this medication has been shown to slow progression of neurodegenerative illness in some individuals.   For males, the CDC characterizes "heavy drinking" as consuming 15 or more alcoholic beverages per week. Mr. Dobler reported consuming 3-4 glasses of wine per night, equating to 21-28 drinks per week. As this was reported to have been ongoing for the past two years, this would certainly constitute chronic heavy drinking and would place him at an increased risk for various health complications. Alcohol use could also be contributing to ongoing cognitive decline. He is encouraged to significantly decrease his nightly alcohol intake.    Mr. Assefa is encouraged to attend to lifestyle factors for brain health (e.g., regular physical exercise, good nutrition habits, regular participation in cognitively-stimulating activities, and general stress management techniques), which are likely to have benefits for both emotional adjustment and cognition. Optimal control of vascular risk factors (including safe cardiovascular exercise and adherence to dietary recommendations) is encouraged.  Should there be a progression of his current deficits over time, Mr. Flahive is unlikely to regain any independent living skills lost. Therefore, it is recommended that he remain as involved as possible in all aspects of household chores, finances, and medication management, with supervision to ensure adequate performance. He will likely benefit from the establishment and maintenance of a routine in order to maximize his functional  abilities over time.  It will be important for Mr. Allston to have another person with him when in situations where he may need to process information, weigh the pros and cons of different options, and make decisions, in order to ensure that he fully understands and recalls all information to be considered.  If not already done, Mr. Bernstein and his family may want to discuss his wishes regarding durable power of attorney and medical decision making, so that he can have input into these choices. Additionally, they may wish to discuss future plans for caretaking and seek out community options for in home/residential care should they become necessary.  Important information should be presented in written format in all instances. This should be placed in a highly visible and commonly frequented location within his home to help promote recall.   To address problems with processing speed, he may wish to consider:   -Ensuring that he is alerted when essential material or instructions are being presented   -Adjusting the speed at which new information is presented   -Allowing for more time in comprehending, processing, and responding in conversation  To address problems with fluctuating attention, he may wish to consider:   -Avoiding external distractions when needing to concentrate   -Limiting exposure to fast paced environments with multiple sensory demands   -Writing down complicated information and using checklists   -Attempting and completing one task at a time (i.e., no multi-tasking)   -Verbalizing aloud each step of a task to maintain focus   -Reducing the amount of information considered at one time

## 2019-11-19 NOTE — Progress Notes (Signed)
   Neuropsychology Feedback Session [name]. [facility] [place] Department of Neurology  Reason for Referral:   Randy Bean a 69 y.o. right-handed Caucasian male referred by [name], M.D.,to characterize hiscurrent cognitive functioning and assist with diagnostic clarity and treatment planning in the context of subjective cognitive decline.   Feedback:   [name] completed a comprehensive neuropsychological evaluation on 11/12/2019. Please refer to that encounter for the full report and recommendations. Briefly, results suggested prominent deficits in learning and memory, executive functioning, and phonemic fluency. Performance variability was additionally exhibited across processing speed and visuospatial/constructional abilities. The etiology for ongoing cognitive deficits is unclear. Profound verbal memory impairment with very poor retrieval, recognition, and retention percentages is certainly concerning for Alzheimer's disease and this condition should remain on his differential. Deficits in executive and visuospatial functioning would fall in line with this presentation. However, not all cognitive performances were consistent with this presentation. This is especially true surrounding intact semantic fluency and confrontation naming scores, which were unexpected relative to the extent of memory impairment. While less likely, Lewy body dementia should remain on the differential given deficits in visuospatial and executive abilities. However, memory deficits appear more significant than what would be expected in earlier stages of this illness and [name] and his wife did not report common behavioral characteristics of this condition.  [name] was accompanied by his wife during the current telephone call. They were within their residence while I was within my office. Content of the current session focused on the results of his neuropsychological evaluation. Mr.  Ambers and his wife were given the opportunity to ask questions and their questions were answered. They were encouraged to reach out should additional questions arise. His report is available to him on MyChart.      19 minutes were spent conducting the current feedback session with [name], billed as one unit [phone].

## 2020-02-12 ENCOUNTER — Other Ambulatory Visit: Payer: Self-pay | Admitting: Family Medicine

## 2020-02-25 DIAGNOSIS — R69 Illness, unspecified: Secondary | ICD-10-CM | POA: Diagnosis not present

## 2020-05-06 ENCOUNTER — Ambulatory Visit: Payer: Medicare HMO | Admitting: Neurology

## 2020-05-23 ENCOUNTER — Other Ambulatory Visit: Payer: Self-pay | Admitting: Family Medicine

## 2020-07-23 NOTE — Progress Notes (Unsigned)
Subjective:   Randy Bean is a 70 y.o. male who presents for Medicare Annual/Subsequent preventive examination.  Review of Systems    N/A  Cardiac Risk Factors include: advanced age (>15men, >23 women);male gender     Objective:    Today's Vitals   07/24/20 1004  BP: 130/60  Pulse: 68  Temp: 98.4 F (36.9 C)  TempSrc: Oral  SpO2: 98%  Weight: 164 lb 5 oz (74.5 kg)  Height: 5\' 7"  (1.702 m)   Body mass index is 25.73 kg/m.  Advanced Directives 07/24/2020 10/08/2019  Does Patient Have a Medical Advance Directive? Yes Yes  Type of 10/10/2019 of [place];Living will -  Does patient want to make changes to medical advance directive? No - Patient declined -  Copy of Healthcare Power of Attorney in Chart? No - copy requested -    Current Medications (verified) Outpatient Encounter Medications as of 07/24/2020  Medication Sig  . aspirin 325 MG tablet Take 325 mg by mouth daily.  . Coenzyme Q10 (COQ10) 100 MG CAPS Take 1 capsule by mouth daily.  09/23/2020 donepezil (ARICEPT) 10 MG tablet Take 1/2 tablet daily for 2 weeks, then increase to 1 tablet daily  . ezetimibe (ZETIA) 10 MG tablet TAKE ONE TABLET BY MOUTH ONE TIME DAILY  . Flax OIL Take 1 tablet by mouth daily.  . Garlic 1000 MG CAPS Take 1 capsule by mouth daily.  Marland Kitchen loratadine (CLARITIN) 10 MG tablet Take 10 mg by mouth daily.  . Omega 3 1200 MG CAPS Take 1 capsule by mouth daily.  . sertraline (ZOLOFT) 50 MG tablet Take 1 tablet (50 mg total) by mouth daily.  . simvastatin (ZOCOR) 20 MG tablet Take 1 tablet (20 mg total) by mouth daily.  . Turmeric 500 MG TABS Take 1 tablet by mouth daily.  . vitamin C (ASCORBIC ACID) 500 MG tablet Take 500 mg by mouth daily.  . [DISCONTINUED] Melatonin 5 MG TABS Take 1 tablet by mouth daily.   No facility-administered encounter medications on file as of 07/24/2020.    Allergies (verified) Patient has no known allergies.   History: Past Medical History:  Diagnosis  Date  . Allergy   . Depression   . Generalized anxiety disorder 03/31/2016  . History of head injury   . Hyperlipidemia   . Mild neurocognitive disorder 11/12/2019   History reviewed. No pertinent surgical history. Family History  Problem Relation Age of Onset  . Mental illness Mother   . Memory loss Mother   . COPD Father   . Memory loss Brother        Older half-brother   Social History   Socioeconomic History  . Marital status: Married    Spouse name: Not on file  . Number of children: Not on file  . Years of education: 92  . Highest education level: Some college, no degree  Occupational History  . Occupation: retired  Tobacco Use  . Smoking status: Former Smoker    Quit date: 05/24/1973    Years since quitting: 47.2  . Smokeless tobacco: Former 07/22/1973    Types: Chew  Substance and Sexual Activity  . Alcohol use: Yes    Alcohol/week: 21.0 - 28.0 standard drinks    Types: 21 - 28 Glasses of wine per week    Comment: 3-4 glasses of win per night  . Drug use: Never  . Sexual activity: Yes  Other Topics Concern  . Not on file  Social History Narrative  Right handed   One story home   Social Determinants of Health   Financial Resource Strain: Low Risk   . Difficulty of Paying Living Expenses: Not hard at all  Food Insecurity: No Food Insecurity  . Worried About Programme researcher, broadcasting/film/video in the Last Year: Never true  . Ran Out of Food in the Last Year: Never true  Transportation Needs: No Transportation Needs  . Lack of Transportation (Medical): No  . Lack of Transportation (Non-Medical): No  Physical Activity: Insufficiently Active  . Days of Exercise per Week: 7 days  . Minutes of Exercise per Session: 20 min  Stress: No Stress Concern Present  . Feeling of Stress : Not at all  Social Connections: Socially Integrated  . Frequency of Communication with Friends and Family: More than three times a week  . Frequency of Social Gatherings with Friends and Family: More  than three times a week  . Attends Religious Services: More than 4 times per year  . Active Member of Clubs or Organizations: Yes  . Attends Banker Meetings: More than 4 times per year  . Marital Status: Married    Tobacco Counseling Counseling given: Not Answered   Clinical Intake:  Pre-visit preparation completed: Yes  Pain : No/denies pain     Nutritional Risks: None Diabetes: No  How often do you need to have someone help you when you read instructions, pamphlets, or other written materials from your doctor or pharmacy?: 1 - Never What is the last grade level you completed in school?: College  Diabetic?No   Interpreter Needed?: No  Information entered by :: SCrews,LPN   Activities of Daily Living In your present state of health, do you have any difficulty performing the following activities: 07/24/2020  Hearing? N  Vision? N  Difficulty concentrating or making decisions? Y  Walking or climbing stairs? N  Dressing or bathing? N  Doing errands, shopping? N  Preparing Food and eating ? N  Using the Toilet? N  In the past six months, have you accidently leaked urine? N  Do you have problems with loss of bowel control? N  Managing your Medications? N  Managing your Finances? N  Housekeeping or managing your Housekeeping? N  Some recent data might be hidden    Patient Care Team: [place], Betty G, MD as PCP - General (Family Medicine) Van Clines, MD as Consulting Physician (Neurology)  Indicate any recent Medical Services you may have received from other than [facility] providers in the past year (date may be approximate).     Assessment:   This is a routine wellness examination for Randy Bean.  Hearing/Vision screen  Hearing Screening   125Hz  250Hz  500Hz  1000Hz  2000Hz  3000Hz  4000Hz  6000Hz  8000Hz   Right ear:           Left ear:           Vision Screening Comments: Gets eye exams every year. Wears eye glasses   Dietary issues and exercise  activities discussed: Current Exercise Habits: Home exercise routine, Type of exercise: walking, Time (Minutes): 20, Frequency (Times/Week): 7, Weekly Exercise (Minutes/Week): 140  Goals    . Patient Stated     I would like to get my garden planted.      Depression Screen PHQ 2/9 Scores 07/24/2020 10/08/2019 07/12/2019 06/07/2018 04/01/2017  PHQ - 2 Score 0 0 0 0 0    Fall Risk Fall Risk  07/24/2020 10/08/2019 07/12/2019 06/07/2018 04/01/2017  Falls in the past year? 0  0 0 0 No  Number falls in past yr: 0 0 0 0 -  Injury with Fall? 0 0 0 0 -  Risk for fall due to : No Fall Risks - - - -  Follow up Falls evaluation completed;Falls prevention discussed - Education provided Education provided -    FALL RISK PREVENTION PERTAINING TO THE HOME:  Any stairs in or around the home? Yes  If so, are there any without handrails? No  Home free of loose throw rugs in walkways, pet beds, electrical cords, etc? Yes  Adequate lighting in your home to reduce risk of falls? Yes   ASSISTIVE DEVICES UTILIZED TO PREVENT FALLS:  Life alert? No  Use of a cane, walker or w/c? No  Grab bars in the bathroom? No  Shower chair or bench in shower? No  Elevated toilet seat or a handicapped toilet? No   TIMED UP AND GO:  Was the test performed? Yes .  Length of time to ambulate 10 feet: 3 sec.   Gait steady and fast without use of assistive device  Cognitive Function:        Immunizations Immunization History  Administered Date(s) Administered  . Hepatitis A 07/15/1994, 01/15/1995  . Hepatitis B 07/15/1994, 09/15/1994, 01/15/1995  . Influenza, High Dose Seasonal PF 03/31/2016, 02/09/2018, 01/30/2019, 02/25/2020  . Influenza-Unspecified 03/21/2017, 02/09/2018  . Meningococcal Conjugate 07/15/1994  . PFIZER(Purple Top)SARS-COV-2 Vaccination 07/05/2019, 07/28/2019, 03/03/2020  . Pneumococcal Conjugate-13 02/28/2019  . Pneumococcal Polysaccharide-23 04/01/2017  . Tdap 02/27/2007  . Zoster 02/12/2014  .  Zoster Recombinat (Shingrix) 12/09/2017, 12/09/2017, 02/09/2018    TDAP status: Due, Education has been provided regarding the importance of this vaccine. Advised may receive this vaccine at local pharmacy or Health Dept. Aware to provide a copy of the vaccination record if obtained from local pharmacy or Health Dept. Verbalized acceptance and understanding.  Flu Vaccine status: Up to date  Pneumococcal vaccine status: Up to date  Covid-19 vaccine status: Completed vaccines  Qualifies for Shingles Vaccine? Yes   Zostavax completed Yes   Shingrix Completed?: Yes  Screening Tests Health Maintenance  Topic Date Due  . TETANUS/TDAP  02/26/2017  . COLONOSCOPY (Pts 45-40yrs Insurance coverage will need to be confirmed)  05/24/2024  . INFLUENZA VACCINE  Completed  . COVID-19 Vaccine  Completed  . Hepatitis C Screening  Completed  . PNA vac Low Risk Adult  Completed  . HPV VACCINES  Aged Out    Health Maintenance  Health Maintenance Due  Topic Date Due  . TETANUS/TDAP  02/26/2017    Colorectal cancer screening: Type of screening: Colonoscopy. Completed 05/24/2014. Repeat every 10 years  Lung Cancer Screening: (Low Dose CT Chest recommended if Age 54-80 years, 30 pack-year currently smoking OR have quit w/in 15years.) does not qualify.   Lung Cancer Screening Referral: N/A   Additional Screening:  Hepatitis C Screening: does qualify; Completed 04/01/2017  Vision Screening: Recommended annual ophthalmology exams for early detection of glaucoma and other disorders of the eye. Is the patient up to date with their annual eye exam?  Yes  Who is the provider or what is the name of the office in which the patient attends annual eye exams? Dr. Anner Crete If pt is not established with a provider, would they like to be referred to a provider to establish care? No .   Dental Screening: Recommended annual dental exams for proper oral hygiene  Community Resource Referral / Chronic Care  Management: CRR required this visit?  No  CCM required this visit?  No      Plan:     I have personally reviewed and noted the following in the patient's chart:   . Medical and social history . Use of alcohol, tobacco or illicit drugs  . Current medications and supplements . Functional ability and status . Nutritional status . Physical activity . Advanced directives . List of other physicians . Hospitalizations, surgeries, and ER visits in previous 12 months . Vitals . Screenings to include cognitive, depression, and falls . Referrals and appointments  In addition, I have reviewed and discussed with patient certain preventive protocols, quality metrics, and best practice recommendations. A written personalized care plan for preventive services as well as general preventive health recommendations were provided to patient.     Theodora BlowShannon Crews, LPN   4/0/98113/07/2020   Nurse Notes: None

## 2020-07-24 ENCOUNTER — Other Ambulatory Visit: Payer: Self-pay

## 2020-07-24 ENCOUNTER — Ambulatory Visit (INDEPENDENT_AMBULATORY_CARE_PROVIDER_SITE_OTHER): Payer: Medicare HMO

## 2020-07-24 VITALS — BP 130/60 | HR 68 | Temp 98.4°F | Ht 67.0 in | Wt 164.3 lb

## 2020-07-24 DIAGNOSIS — Z0000 Encounter for general adult medical examination without abnormal findings: Secondary | ICD-10-CM | POA: Diagnosis not present

## 2020-07-24 NOTE — Patient Instructions (Signed)
Randy Bean , Thank you for taking time to come for your Medicare Wellness Visit. I appreciate your ongoing commitment to your health goals. Please review the following plan we discussed and let me know if I can assist you in the future.   Screening recommendations/referrals: Colonoscopy: Up to date, next due 05/24/2024 Recommended yearly ophthalmology/optometry visit for glaucoma screening and checkup Recommended yearly dental visit for hygiene and checkup  Vaccinations: Influenza vaccine: Up to date, next due fall 2022  Pneumococcal vaccine: Completed series  Tdap vaccine: Currently due, you may await and injury to receive  Shingles vaccine: Completed series     Advanced directives: Please bring in copies of your advanced medical directives into our office so that we may scan them into your chart.  Conditions/risks identified: None   Next appointment: 09/01/2020 @ 10:30 am with Dr. [place]   Preventive Care 16 Years and Older, Male Preventive care refers to lifestyle choices and visits with your health care provider that can promote health and wellness. What does preventive care include?  A yearly physical exam. This is also called an annual well check.  Dental exams once or twice a year.  Routine eye exams. Ask your health care provider how often you should have your eyes checked.  Personal lifestyle choices, including:  Daily care of your teeth and gums.  Regular physical activity.  Eating a healthy diet.  Avoiding tobacco and drug use.  Limiting alcohol use.  Practicing safe sex.  Taking low doses of aspirin every day.  Taking vitamin and mineral supplements as recommended by your health care provider. What happens during an annual well check? The services and screenings done by your health care provider during your annual well check will depend on your age, overall health, lifestyle risk factors, and family history of disease. Counseling  Your health care  provider may ask you questions about your:  Alcohol use.  Tobacco use.  Drug use.  Emotional well-being.  Home and relationship well-being.  Sexual activity.  Eating habits.  History of falls.  Memory and ability to understand (cognition).  Work and work Astronomer. Screening  You may have the following tests or measurements:  Height, weight, and BMI.  Blood pressure.  Lipid and cholesterol levels. These may be checked every 5 years, or more frequently if you are over 81 years old.  Skin check.  Lung cancer screening. You may have this screening every year starting at age 42 if you have a 30-pack-year history of smoking and currently smoke or have quit within the past 15 years.  Fecal occult blood test (FOBT) of the stool. You may have this test every year starting at age 83.  Flexible sigmoidoscopy or colonoscopy. You may have a sigmoidoscopy every 5 years or a colonoscopy every 10 years starting at age 36.  Prostate cancer screening. Recommendations will vary depending on your family history and other risks.  Hepatitis C blood test.  Hepatitis B blood test.  Sexually transmitted disease (STD) testing.  Diabetes screening. This is done by checking your blood sugar (glucose) after you have not eaten for a while (fasting). You may have this done every 1-3 years.  Abdominal aortic aneurysm (AAA) screening. You may need this if you are a current or former smoker.  Osteoporosis. You may be screened starting at age 39 if you are at high risk. Talk with your health care provider about your test results, treatment options, and if necessary, the need for more tests. Vaccines  Your  health care provider may recommend certain vaccines, such as:  Influenza vaccine. This is recommended every year.  Tetanus, diphtheria, and acellular pertussis (Tdap, Td) vaccine. You may need a Td booster every 10 years.  Zoster vaccine. You may need this after age 53.  Pneumococcal  13-valent conjugate (PCV13) vaccine. One dose is recommended after age 19.  Pneumococcal polysaccharide (PPSV23) vaccine. One dose is recommended after age 65. Talk to your health care provider about which screenings and vaccines you need and how often you need them. This information is not intended to replace advice given to you by your health care provider. Make sure you discuss any questions you have with your health care provider. Document Released: 06/06/2015 Document Revised: 01/28/2016 Document Reviewed: 03/11/2015 Elsevier Interactive Patient Education  2017 [place] Prevention in the Home Falls can cause injuries. They can happen to people of all ages. There are many things you can do to make your home safe and to help prevent falls. What can I do on the outside of my home?  Regularly fix the edges of walkways and driveways and fix any cracks.  Remove anything that might make you trip as you walk through a door, such as a raised step or threshold.  Trim any bushes or trees on the path to your home.  Use bright outdoor lighting.  Clear any walking paths of anything that might make someone trip, such as rocks or tools.  Regularly check to see if handrails are loose or broken. Make sure that both sides of any steps have handrails.  Any raised decks and porches should have guardrails on the edges.  Have any leaves, snow, or ice cleared regularly.  Use sand or salt on walking paths during winter.  Clean up any spills in your garage right away. This includes oil or grease spills. What can I do in the bathroom?  Use night lights.  Install grab bars by the toilet and in the tub and shower. Do not use towel bars as grab bars.  Use non-skid mats or decals in the tub or shower.  If you need to sit down in the shower, use a plastic, non-slip stool.  Keep the floor dry. Clean up any water that spills on the floor as soon as it happens.  Remove soap buildup in the  tub or shower regularly.  Attach bath mats securely with double-sided non-slip rug tape.  Do not have throw rugs and other things on the floor that can make you trip. What can I do in the bedroom?  Use night lights.  Make sure that you have a light by your bed that is easy to reach.  Do not use any sheets or blankets that are too big for your bed. They should not hang down onto the floor.  Have a firm chair that has side arms. You can use this for support while you get dressed.  Do not have throw rugs and other things on the floor that can make you trip. What can I do in the kitchen?  Clean up any spills right away.  Avoid walking on wet floors.  Keep items that you use a lot in easy-to-reach places.  If you need to reach something above you, use a strong step stool that has a grab bar.  Keep electrical cords out of the way.  Do not use floor polish or wax that makes floors slippery. If you must use wax, use non-skid floor wax.  Do not  have throw rugs and other things on the floor that can make you trip. What can I do with my stairs?  Do not leave any items on the stairs.  Make sure that there are handrails on both sides of the stairs and use them. Fix handrails that are broken or loose. Make sure that handrails are as long as the stairways.  Check any carpeting to make sure that it is firmly attached to the stairs. Fix any carpet that is loose or worn.  Avoid having throw rugs at the top or bottom of the stairs. If you do have throw rugs, attach them to the floor with carpet tape.  Make sure that you have a light switch at the top of the stairs and the bottom of the stairs. If you do not have them, ask someone to add them for you. What else can I do to help prevent falls?  Wear shoes that:  Do not have high heels.  Have rubber bottoms.  Are comfortable and fit you well.  Are closed at the toe. Do not wear sandals.  If you use a stepladder:  Make sure that it  is fully opened. Do not climb a closed stepladder.  Make sure that both sides of the stepladder are locked into place.  Ask someone to hold it for you, if possible.  Clearly mark and make sure that you can see:  Any grab bars or handrails.  First and last steps.  Where the edge of each step is.  Use tools that help you move around (mobility aids) if they are needed. These include:  Canes.  Walkers.  Scooters.  Crutches.  Turn on the lights when you go into a dark area. Replace any light bulbs as soon as they burn out.  Set up your furniture so you have a clear path. Avoid moving your furniture around.  If any of your floors are uneven, fix them.  If there are any pets around you, be aware of where they are.  Review your medicines with your doctor. Some medicines can make you feel dizzy. This can increase your chance of falling. Ask your doctor what other things that you can do to help prevent falls. This information is not intended to replace advice given to you by your health care provider. Make sure you discuss any questions you have with your health care provider. Document Released: 03/06/2009 Document Revised: 10/16/2015 Document Reviewed: 06/14/2014 Elsevier Interactive Patient Education  2017 [employer].

## 2020-08-18 ENCOUNTER — Other Ambulatory Visit: Payer: Self-pay | Admitting: Family Medicine

## 2020-08-18 DIAGNOSIS — F411 Generalized anxiety disorder: Secondary | ICD-10-CM

## 2020-08-29 ENCOUNTER — Other Ambulatory Visit: Payer: Self-pay

## 2020-09-01 ENCOUNTER — Other Ambulatory Visit: Payer: Self-pay

## 2020-09-01 ENCOUNTER — Encounter: Payer: Self-pay | Admitting: Family Medicine

## 2020-09-01 ENCOUNTER — Ambulatory Visit (INDEPENDENT_AMBULATORY_CARE_PROVIDER_SITE_OTHER): Payer: Medicare HMO | Admitting: Family Medicine

## 2020-09-01 VITALS — BP 124/70 | HR 68 | Temp 98.3°F | Resp 16 | Ht 67.0 in | Wt 162.5 lb

## 2020-09-01 DIAGNOSIS — E785 Hyperlipidemia, unspecified: Secondary | ICD-10-CM | POA: Diagnosis not present

## 2020-09-01 DIAGNOSIS — Z87891 Personal history of nicotine dependence: Secondary | ICD-10-CM | POA: Diagnosis not present

## 2020-09-01 DIAGNOSIS — F411 Generalized anxiety disorder: Secondary | ICD-10-CM

## 2020-09-01 DIAGNOSIS — Z0000 Encounter for general adult medical examination without abnormal findings: Secondary | ICD-10-CM | POA: Diagnosis not present

## 2020-09-01 DIAGNOSIS — R69 Illness, unspecified: Secondary | ICD-10-CM | POA: Diagnosis not present

## 2020-09-01 LAB — LIPID PANEL
Cholesterol: 175 mg/dL (ref 0–200)
HDL: 47 mg/dL (ref >39.00)
LDL Cholesterol: 89 mg/dL (ref 0–99)
NonHDL: 127.99
Total CHOL/HDL Ratio: 4
Triglycerides: 195 mg/dL — ABNORMAL HIGH (ref 0.0–149.0)
VLDL: 39 mg/dL (ref 0.0–40.0)

## 2020-09-01 LAB — COMPREHENSIVE METABOLIC PANEL
ALT: 40 U/L (ref 0–53)
AST: 29 U/L (ref 0–37)
Albumin: 4.5 g/dL (ref 3.5–5.2)
Alkaline Phosphatase: 55 U/L (ref 39–117)
BUN: 14 mg/dL (ref 6–23)
CO2: 31 mEq/L (ref 19–32)
Calcium: 9.7 mg/dL (ref 8.4–10.5)
Chloride: 102 mEq/L (ref 96–112)
Creatinine, Ser: 0.81 mg/dL (ref 0.40–1.50)
GFR: 88.26 mL/min (ref >60.00)
Glucose, Bld: 89 mg/dL (ref 70–99)
Potassium: 4.5 mEq/L (ref 3.5–5.1)
Sodium: 139 mEq/L (ref 135–145)
Total Bilirubin: 0.6 mg/dL (ref 0.2–1.2)
Total Protein: 6.9 g/dL (ref 6.0–8.3)

## 2020-09-01 MED ORDER — SERTRALINE HCL 50 MG PO TABS: 1.0000 | ORAL_TABLET | Freq: Every day | ORAL | 2 refills | Status: DC

## 2020-09-01 NOTE — Patient Instructions (Addendum)
A few things to remember from today's visit:  Routine general medical examination at a health care facility  Generalized anxiety disorder - Plan: sertraline (ZOLOFT) 50 MG tablet  Hyperlipidemia, unspecified hyperlipidemia type - Plan: Comprehensive metabolic panel, Lipid panel  Former cigarette smoker  No changes in your medications today.  If you need refills please call your pharmacy. Do not use My Chart to request refills or for acute issues that need immediate attention.   Please be sure medication list is accurate. If a new problem present, please set up appointment sooner than planned today.  At least 150 minutes of moderate exercise per week, daily brisk walking for 15-30 min is a good exercise option. Healthy diet low in saturated (animal) fats and sweets and consisting of fresh fruits and vegetables, lean meats such as fish and white chicken and whole grains.  - Vaccines:  Tdap vaccine every 10 years. Better cover at your pharmacy.  Shingles vaccine recommended at age 72, could be given after 72 years of age but not sure about insurance coverage.Completed.  Pneumonia vaccines: Pneumovax at 72. Completed.  -Screening recommendations for low/normal risk males:  Lipid screening at 35 and every 3 years. Screening starts in younger males with cardiovascular risk factors.N/A  Colon cancer screening is now at age 72 but your insurance may not cover until age 69 .screening is recommended age 72.  Prostate cancer screening: some controversy, starts usually at 50: Rectal exam and PSA.  Aortic Abdominal Aneurism once between 72 and 23 years old if ever smoker.  Also recommended:  1. Dental visit- Brush and floss your teeth twice daily; visit your dentist twice a year. 2. Eye doctor- Get an eye exam at least every 2 years. 3. Helmet use- Always wear a helmet when riding a bicycle, motorcycle, rollerblading or skateboarding. 4. Safe sex- If you may be exposed to sexually  transmitted infections, use a condom. 5. Seat belts- Seat belts can save your live; always wear one. 6. Smoke/Carbon Monoxide detectors- These detectors need to be installed on the appropriate level of your home. Replace batteries at least once a year. 7. Skin cancer- When out in the sun please cover up and use sunscreen 15 SPF or higher. 8. Violence- If anyone is threatening or hurting you, please tell your healthcare provider.  9. Drink alcohol in moderation- Limit alcohol intake to one drink or less per day. Never drink and drive.

## 2020-09-01 NOTE — Progress Notes (Signed)
HPI:  Randy Bean is a 70 y.o.male here today with his wife for his routine physical examination.  Last CPE: 07/09/19. He lives with his wife. Sleeping about 8-10 hours.  Regular exercise 3 or more times per week: Started back walking daily after a couple months of not doing so. His mother-in law had some health problems and was staying with him and his wife. Following a healthy diet: Yes, his wife cooks.Plenty of vegetables,fish, and usually meat for supper.  Chronic medical problems: HLD, anxiety,allergies,and MCI among some. Since his last visit he has followed with Dr Karel JarvisAquino. He is on Aricept 10 mg daily. His wife states that medication has helped to stabilize memory problems.  Immunization History  Administered Date(s) Administered  . Hepatitis A 07/15/1994, 01/15/1995  . Hepatitis B 07/15/1994, 09/15/1994, 01/15/1995  . Influenza, High Dose Seasonal PF 03/31/2016, 02/09/2018, 01/30/2019, 02/25/2020  . Influenza-Unspecified 03/21/2017, 02/09/2018  . Meningococcal Conjugate 07/15/1994  . PFIZER(Purple Top)SARS-COV-2 Vaccination 07/05/2019, 07/28/2019, 03/03/2020  . Pneumococcal Conjugate-13 02/28/2019  . Pneumococcal Polysaccharide-23 04/01/2017  . Tdap 02/27/2007  . Zoster 02/12/2014  . Zoster Recombinat (Shingrix) 12/09/2017, 12/09/2017, 02/09/2018   -Hep C screening: 04/01/17 NR. Last colon cancer screening: 04/04/2015. Last prostate ca screening: PSA in 06/07/18. Nocturia occasionally.  Negative for high alcohol intake. Former smoker.  -Concerns and/or follow up today:  HLD: He is on Simvastatin 20 mg daily and Zetia 10 mg daily. He is tolerating medication well.  Lab Results  Component Value Date   CHOL 202 (H) 07/16/2019   HDL 58.90 07/16/2019   LDLCALC 92 04/01/2017   LDLDIRECT 113.0 07/16/2019   TRIG 207.0 (H) 07/16/2019   CHOLHDL 3 07/16/2019   Anxiety: He is on Sertraline 50 mg daily. He has been on medication sine 03/2016. Medication is still  helping,problem is well controlled most of the time. Problem is exacerbated by getting visitors at home, he does not adjust well to changes around him.  Review of Systems  Constitutional: Negative for activity change, appetite change, fatigue and fever.  HENT: Negative for mouth sores, nosebleeds and sore throat.   Eyes: Negative for redness and visual disturbance.  Respiratory: Negative for cough, shortness of breath and wheezing.   Cardiovascular: Negative for chest pain, palpitations and leg swelling.  Gastrointestinal: Negative for abdominal pain, blood in stool, nausea and vomiting.  Endocrine: Negative for cold intolerance, heat intolerance, polydipsia, polyphagia and polyuria.  Genitourinary: Negative for decreased urine volume, dysuria, genital sores, hematuria and testicular pain.  Musculoskeletal: Negative for gait problem and myalgias.  Skin: Negative for color change and rash.  Allergic/Immunologic: Positive for environmental allergies.  Neurological: Negative for syncope, weakness and headaches.  Psychiatric/Behavioral: Negative for behavioral problems, confusion, hallucinations and sleep disturbance. The patient is nervous/anxious.   All other systems reviewed and are negative.  Current Outpatient Medications on File Prior to Visit  Medication Sig Dispense Refill  . aspirin 325 MG tablet Take 325 mg by mouth daily.    . Coenzyme Q10 (COQ10) 100 MG CAPS Take 1 capsule by mouth daily.    Marland Kitchen. donepezil (ARICEPT) 10 MG tablet Take 1/2 tablet daily for 2 weeks, then increase to 1 tablet daily 30 tablet 11  . ezetimibe (ZETIA) 10 MG tablet TAKE ONE TABLET BY MOUTH ONE TIME DAILY 90 tablet 0  . Flax OIL Take 1 tablet by mouth daily.    . Garlic 1000 MG CAPS Take 1 capsule by mouth daily.    Marland Kitchen. loratadine (CLARITIN) 10 MG tablet  Take 10 mg by mouth daily.    . Omega 3 1200 MG CAPS Take 1 capsule by mouth daily.    . simvastatin (ZOCOR) 20 MG tablet Take 1 tablet (20 mg total) by mouth  daily. 90 tablet 3  . vitamin C (ASCORBIC ACID) 500 MG tablet Take 500 mg by mouth daily.     No current facility-administered medications on file prior to visit.   Past Medical History:  Diagnosis Date  . Allergy   . Depression   . Generalized anxiety disorder 03/31/2016  . History of head injury   . Hyperlipidemia   . Mild neurocognitive disorder 11/12/2019   History reviewed. No pertinent surgical history.  No Known Allergies  Family History  Problem Relation Age of Onset  . Mental illness Mother   . Memory loss Mother   . COPD Father   . Memory loss Brother        Older half-brother    Social History   Socioeconomic History  . Marital status: Married    Spouse name: Not on file  . Number of children: Not on file  . Years of education: 77  . Highest education level: Some college, no degree  Occupational History  . Occupation: retired  Tobacco Use  . Smoking status: Former Smoker    Quit date: 05/24/1973    Years since quitting: 47.3  . Smokeless tobacco: Former Neurosurgeon    Types: Chew  Substance and Sexual Activity  . Alcohol use: Yes    Alcohol/week: 21.0 - 28.0 standard drinks    Types: 21 - 28 Glasses of wine per week    Comment: 3-4 glasses of win per night  . Drug use: Never  . Sexual activity: Yes  Other Topics Concern  . Not on file  Social History Narrative   Right handed   One story home   Social Determinants of Health   Financial Resource Strain: Low Risk   . Difficulty of Paying Living Expenses: Not hard at all  Food Insecurity: No Food Insecurity  . Worried About Programme researcher, broadcasting/film/video in the Last Year: Never true  . Ran Out of Food in the Last Year: Never true  Transportation Needs: No Transportation Needs  . Lack of Transportation (Medical): No  . Lack of Transportation (Non-Medical): No  Physical Activity: Insufficiently Active  . Days of Exercise per Week: 7 days  . Minutes of Exercise per Session: 20 min  Stress: No Stress Concern Present   . Feeling of Stress : Not at all  Social Connections: Socially Integrated  . Frequency of Communication with Friends and Family: More than three times a week  . Frequency of Social Gatherings with Friends and Family: More than three times a week  . Attends Religious Services: More than 4 times per year  . Active Member of Clubs or Organizations: Yes  . Attends Banker Meetings: More than 4 times per year  . Marital Status: Married   Vitals:   09/01/20 1019  BP: 124/70  Pulse: 68  Resp: 16  Temp: 98.3 F (36.8 C)  SpO2: 98%   Body mass index is 25.45 kg/m.   Wt Readings from Last 3 Encounters:  09/01/20 162 lb 8 oz (73.7 kg)  07/24/20 164 lb 5 oz (74.5 kg)  10/08/19 165 lb (74.8 kg)      Physical Exam Vitals and nursing note reviewed.  Constitutional:      General: He is not in acute distress.  Appearance: He is well-developed.  HENT:     Head: Atraumatic.     Right Ear: External ear normal. Tympanic membrane is not erythematous.     Left Ear: External ear normal. Tympanic membrane is not erythematous.     Ears:     Comments: Cerumen excess bilateral, TM seen partially.    Mouth/Throat:     Mouth: Mucous membranes are moist.     Pharynx: Oropharynx is clear.  Eyes:     Conjunctiva/sclera: Conjunctivae normal.     Pupils: Pupils are equal, round, and reactive to light.  Neck:     Thyroid: No thyromegaly.     Trachea: No tracheal deviation.  Cardiovascular:     Rate and Rhythm: Normal rate and regular rhythm.     Pulses:          Dorsalis pedis pulses are 2+ on the right side and 2+ on the left side.     Heart sounds: No murmur heard.   Pulmonary:     Effort: Pulmonary effort is normal. No respiratory distress.     Breath sounds: Normal breath sounds.  Chest:  Breasts:     Right: No supraclavicular adenopathy.     Left: No supraclavicular adenopathy.    Abdominal:     Palpations: Abdomen is soft. There is no hepatomegaly or mass.      Tenderness: There is no abdominal tenderness.  Genitourinary:    Comments: Refused,no concerns. Musculoskeletal:        General: No tenderness.     Cervical back: Normal range of motion.     Comments: No major deformities appreciated and no signs of synovitis.  Lymphadenopathy:     Cervical: No cervical adenopathy.     Upper Body:     Right upper body: No supraclavicular adenopathy.     Left upper body: No supraclavicular adenopathy.  Skin:    General: Skin is warm.     Findings: No erythema.  Neurological:     Mental Status: He is alert.     Cranial Nerves: No cranial nerve deficit.     Sensory: No sensory deficit.     Gait: Gait normal.     Deep Tendon Reflexes:     Reflex Scores:      Bicep reflexes are 2+ on the right side and 2+ on the left side.      Patellar reflexes are 2+ on the right side and 2+ on the left side.  ASSESSMENT AND PLAN:  RandyRandy Bean was seen today for annual exam.  Diagnoses and all orders for this visit: Orders Placed This Encounter  Procedures  . Comprehensive metabolic panel  . Lipid panel  . VAS US [facility]A MEDICARE SCREEN   Lab Results  Component Value Date   CREATININE 0.81 09/01/2020   BUN 14 09/01/2020   NA 139 09/01/2020   K 4.5 09/01/2020   CL 102 09/01/2020   CO2 31 09/01/2020   Lab Results  Component Value Date   CHOL 175 09/01/2020   HDL 47.00 09/01/2020   LDLCALC 89 09/01/2020   LDLDIRECT 113.0 07/16/2019   TRIG 195.0 (H) 09/01/2020   CHOLHDL 4 09/01/2020   Lab Results  Component Value Date   ALT 40 09/01/2020   AST 29 09/01/2020   ALKPHOS 55 09/01/2020   BILITOT 0.6 09/01/2020   Routine general medical examination at a health care facility We discussed the importance of regular physical activity and healthy diet for prevention of chronic illness and/or complications. Preventive  guidelines reviewed. Vaccination completed. Fall precautions. Next CPE in a year.  He takes daily Aspirin 325 mg daily, some side effects  discussed. No clear indication, recommend decreasing dose to 81 mg daily.  Generalized anxiety disorder Problem is stable. No changes in Sertraline dose. He can continue following annually, before if needed.  -     sertraline (ZOLOFT) 50 MG tablet; Take 1 tablet (50 mg total) by mouth daily.  Hyperlipidemia, unspecified hyperlipidemia type Continue Zetia 10 mg daily and Simvastatin 20 mg daily as well as low fat diet. Further recommendations according to FLP result.  Former cigarette smoker -     VAS US AORTA MEDICARE SCREEN; Future   Return in 1 year (on 09/01/2021) for cpe and f/u.   Dhruti Ghuman G. [place], MD  [facility]. [place] office.  A few things to remember from today's visit:  Routine general medical examination at a health care facility  Generalized anxiety disorder - Plan: sertraline (ZOLOFT) 50 MG tablet  Hyperlipidemia, unspecified hyperlipidemia type - Plan: Comprehensive metabolic panel, Lipid panel  Former cigarette smoker  No changes in your medications today.  If you need refills please call your pharmacy. Do not use My Chart to request refills or for acute issues that need immediate attention.   Please be sure medication list is accurate. If a new problem present, please set up appointment sooner than planned today.  At least 150 minutes of moderate exercise per week, daily brisk walking for 15-30 min is a good exercise option. Healthy diet low in saturated (animal) fats and sweets and consisting of fresh fruits and vegetables, lean meats such as fish and white chicken and whole grains.  - Vaccines:  Tdap vaccine every 10 years. Better cover at your pharmacy.  Shingles vaccine recommended at age 43, could be given after 72 years of age but not sure about insurance coverage.Completed.  Pneumonia vaccines: Pneumovax at 65. Completed.  -Screening recommendations for low/normal risk males:  Lipid screening at 35 and every 3 years. Screening  starts in younger males with cardiovascular risk factors.N/A  Colon cancer screening is now at age 58 but your insurance may not cover until age 82 .screening is recommended age 48.  Prostate cancer screening: some controversy, starts usually at 50: Rectal exam and PSA.  Aortic Abdominal Aneurism once between 60 and 73 years old if ever smoker.  Also recommended:  1. Dental visit- Brush and floss your teeth twice daily; visit your dentist twice a year. 2. Eye doctor- Get an eye exam at least every 2 years. 3. Helmet use- Always wear a helmet when riding a bicycle, motorcycle, rollerblading or skateboarding. 4. Safe sex- If you may be exposed to sexually transmitted infections, use a condom. 5. Seat belts- Seat belts can save your live; always wear one. 6. Smoke/Carbon Monoxide detectors- These detectors need to be installed on the appropriate level of your home. Replace batteries at least once a year. 7. Skin cancer- When out in the sun please cover up and use sunscreen 15 SPF or higher. 8. Violence- If anyone is threatening or hurting you, please tell your healthcare provider.  9. Drink alcohol in moderation- Limit alcohol intake to one drink or less per day. Never drink and drive.

## 2020-09-05 MED ORDER — EZETIMIBE 10 MG PO TABS: 10.0000 mg | ORAL_TABLET | Freq: Every day | ORAL | 3 refills | Status: DC

## 2020-09-05 MED ORDER — SIMVASTATIN 20 MG PO TABS: 20.0000 mg | ORAL_TABLET | Freq: Every day | ORAL | 3 refills | Status: DC

## 2020-09-06 ENCOUNTER — Other Ambulatory Visit: Payer: Self-pay | Admitting: Family Medicine

## 2020-09-06 DIAGNOSIS — E785 Hyperlipidemia, unspecified: Secondary | ICD-10-CM

## 2020-10-09 ENCOUNTER — Telehealth (INDEPENDENT_AMBULATORY_CARE_PROVIDER_SITE_OTHER): Payer: Self-pay | Admitting: Family Medicine

## 2020-10-09 NOTE — Telephone Encounter (Signed)
lvm for patient 09/29/20 to callback to be scheduled.  lvm for patient 5/19/22to callback to be scheudled  Lo.jordan. aorta iliac.

## 2020-10-13 NOTE — Addendum Note (Signed)
Addended by: Kathreen Devoid on: 10/13/2020 07:26 AM   Modules accepted: Orders

## 2020-10-15 ENCOUNTER — Other Ambulatory Visit: Payer: Self-pay

## 2020-10-15 ENCOUNTER — Ambulatory Visit (HOSPITAL_COMMUNITY)
Admission: RE | Admit: 2020-10-15 | Discharge: 2020-10-15 | Disposition: A | Discharge status: Home / Self Care | Payer: Medicare HMO | Source: Ambulatory Visit | Attending: Family Medicine | Admitting: Family Medicine

## 2020-10-15 DIAGNOSIS — Z87891 Personal history of nicotine dependence: Secondary | ICD-10-CM | POA: Diagnosis not present

## 2020-10-15 DIAGNOSIS — Z136 Encounter for screening for cardiovascular disorders: Secondary | ICD-10-CM | POA: Insufficient documentation

## 2020-12-11 ENCOUNTER — Other Ambulatory Visit: Payer: Self-pay | Admitting: Neurology

## 2020-12-11 ENCOUNTER — Other Ambulatory Visit: Payer: Self-pay | Admitting: Family Medicine

## 2020-12-11 DIAGNOSIS — F411 Generalized anxiety disorder: Secondary | ICD-10-CM

## 2021-01-12 ENCOUNTER — Encounter: Payer: Self-pay | Admitting: Neurology

## 2021-01-12 ENCOUNTER — Ambulatory Visit: Payer: Medicare HMO | Admitting: Neurology

## 2021-01-12 ENCOUNTER — Other Ambulatory Visit: Payer: Self-pay

## 2021-01-12 VITALS — BP 146/75 | HR 74 | Ht 66.5 in | Wt 164.6 lb

## 2021-01-12 DIAGNOSIS — G3184 Mild cognitive impairment, so stated: Secondary | ICD-10-CM | POA: Diagnosis not present

## 2021-01-12 MED ORDER — DONEPEZIL HCL 10 MG PO TABS: ORAL_TABLET | ORAL | 3 refills | Status: DC
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
## 2021-01-12 NOTE — Patient Instructions (Signed)
Good to see you!  Schedule repeat Neurocognitive testing  2. Continue Donepezil (Aricept) 10mg  daily  3. Continue to monitor driving  4. Follow-up in 6 months, call for any changes   You have been referred for a neuropsychological evaluation (i.e., evaluation of memory and thinking abilities). Please bring someone with you to this appointment if possible, as it is helpful for the doctor to hear from both you and another adult who knows you well. Please bring eyeglasses and hearing aids if you wear them.    The evaluation will take approximately 3 hours and has two parts:   The first part is a clinical interview with the neuropsychologist (Dr. or Dr. Milbert Coulter). During the interview, the neuropsychologist will speak with you and the individual you brought to the appointment.    The second part of the evaluation is testing with the doctor's technician Roseanne Reno or Annabelle Harman). During the testing, the technician will ask you to remember different types of material, solve problems, and answer some questionnaires. Your family member will not be present for this portion of the evaluation.   Please note: We must reserve several hours of the neuropsychologist's time and the psychometrician's time for your evaluation appointment. As such, there is a No-Show fee of $100. If you are unable to attend any of your appointments, please contact our office as soon as possible to reschedule.    RECOMMENDATIONS FOR ALL PATIENTS WITH MEMORY PROBLEMS: 1. Continue to exercise (Recommend 30 minutes of walking everyday, or 3 hours every week) 2. Increase social interactions - continue going to [place] and enjoy social gatherings with friends and family 3. Eat healthy, avoid fried foods and eat more fruits and vegetables 4. Maintain adequate blood pressure, blood sugar, and blood cholesterol level. Reducing the risk of stroke and cardiovascular disease also helps promoting better memory. 5. Avoid stressful situations.  Live a simple life and avoid aggravations. Organize your time and prepare for the next day in anticipation. 6. Sleep well, avoid any interruptions of sleep and avoid any distractions in the bedroom that may interfere with adequate sleep quality 7. Avoid sugar, avoid sweets as there is a strong link between excessive sugar intake, diabetes, and cognitive impairment We discussed the Mediterranean diet, which has been shown to help patients reduce the risk of progressive memory disorders and reduces cardiovascular risk. This includes eating fish, eat fruits and green leafy vegetables, nuts like almonds and hazelnuts, walnuts, and also use olive oil. Avoid fast foods and fried foods as much as possible. Avoid sweets and sugar as sugar use has been linked to worsening of memory function.  There is always a concern of gradual progression of memory problems. If this is the case, then we may need to adjust level of care according to patient needs. Support, both to the patient and caregiver, should then be put into place.       Mediterranean Diet  Why follow it? Research shows. Those who follow the Mediterranean diet have a reduced risk of heart disease  The diet is associated with a reduced incidence of Parkinson's and Alzheimer's diseases People following the diet may have longer life expectancies and lower rates of chronic diseases  The Dietary Guidelines for Americans recommends the Mediterranean diet as an eating plan to promote health and prevent disease  What Is the Mediterranean Diet?  Healthy eating plan based on typical foods and recipes of Mediterranean-style cooking The diet is primarily a plant based diet; these foods should make up a  majority of meals   Starches - Plant based foods should make up a majority of meals - They are an important sources of vitamins, minerals, energy, antioxidants, and fiber - Choose whole grains, foods high in fiber and minimally processed items  - Typical  grain sources include wheat, oats, barley, corn, brown rice, bulgar, farro, millet, polenta, couscous  - Various types of beans include chickpeas, lentils, fava beans, black beans, white beans   Fruits  Veggies - Large quantities of antioxidant rich fruits & veggies; 6 or more servings  - Vegetables can be eaten raw or lightly drizzled with oil and cooked  - Vegetables common to the traditional Mediterranean Diet include: artichokes, arugula, beets, broccoli, brussel sprouts, cabbage, carrots, celery, collard greens, cucumbers, eggplant, kale, leeks, lemons, lettuce, mushrooms, okra, onions, peas, peppers, potatoes, pumpkin, radishes, rutabaga, shallots, spinach, sweet potatoes, turnips, zucchini - Fruits common to the Mediterranean Diet include: apples, apricots, avocados, cherries, clementines, dates, figs, grapefruits, grapes, melons, nectarines, oranges, peaches, pears, pomegranates, strawberries, tangerines  Fats - Replace butter and margarine with healthy oils, such as olive oil, canola oil, and tahini  - Limit nuts to no more than a handful a day  - Nuts include walnuts, almonds, pecans, pistachios, pine nuts  - Limit or avoid candied, honey roasted or heavily salted nuts - Olives are central to the [employer] - can be eaten whole or used in a variety of dishes   Meats Protein - Limiting red meat: no more than a few times a month - When eating red meat: choose lean cuts and keep the portion to the size of deck of cards - Eggs: approx. 0 to 4 times a week  - Fish and lean poultry: at least 2 a week  - Healthy protein sources include, chicken, [place], lean beef, lamb - Increase intake of seafood such as tuna, salmon, trout, mackerel, shrimp, scallops - Avoid or limit high fat processed meats such as sausage and bacon  Dairy - Include moderate amounts of low fat dairy products  - Focus on healthy dairy such as fat free yogurt, skim milk, low or reduced fat cheese - Limit dairy  products higher in fat such as whole or 2% milk, cheese, ice cream  Alcohol - Moderate amounts of red wine is ok  - No more than 5 oz daily for women (all ages) and men older than age 38  - No more than 10 oz of wine daily for men younger than 94  Other - Limit sweets and other desserts  - Use herbs and spices instead of salt to flavor foods  - Herbs and spices common to the traditional Mediterranean Diet include: basil, bay leaves, chives, cloves, cumin, fennel, garlic, lavender, marjoram, mint, oregano, parsley, pepper, rosemary, sage, savory, sumac, tarragon, thyme   It's not just a diet, it's a lifestyle:  The Mediterranean diet includes lifestyle factors typical of those in the region  Foods, drinks and meals are best eaten with others and savored Daily physical activity is important for overall good health This could be strenuous exercise like running and aerobics This could also be more leisurely activities such as walking, housework, yard-work, or taking the stairs Moderation is the key; a balanced and healthy diet accommodates most foods and drinks Consider portion sizes and frequency of consumption of certain foods   Meal Ideas & Options:  Breakfast:  Whole wheat toast or whole wheat English muffins with peanut butter & hard boiled egg Steel cut oats topped  with apples & cinnamon and skim milk  Fresh fruit: banana, strawberries, melon, berries, peaches  Smoothies: strawberries, bananas, greek yogurt, peanut butter Low fat greek yogurt with blueberries and granola  Egg white omelet with spinach and mushrooms Breakfast couscous: whole wheat couscous, apricots, skim milk, cranberries  Sandwiches:  Hummus and grilled vegetables (peppers, zucchini, squash) on whole wheat bread   Grilled chicken on whole wheat pita with lettuce, tomatoes, cucumbers or tzatziki  [place] salad on whole wheat bread: tuna salad made with greek yogurt, olives, red peppers, capers, green onions Garlic  rosemary lamb pita: lamb sauted with garlic, rosemary, salt & pepper; add lettuce, cucumber, greek yogurt to pita - flavor with lemon juice and black pepper  Seafood:  Mediterranean grilled salmon, seasoned with garlic, basil, parsley, lemon juice and black pepper Shrimp, lemon, and spinach whole-grain pasta salad made with low fat greek yogurt  Seared scallops with lemon orzo  Seared tuna steaks seasoned salt, pepper, coriander topped with tomato mixture of olives, tomatoes, olive oil, minced garlic, parsley, green onions and cappers  Meats:  Herbed greek chicken salad with kalamata olives, cucumber, feta  Red bell peppers stuffed with spinach, bulgur, lean ground beef (or lentils) & topped with feta   Kebabs: skewers of chicken, tomatoes, onions, zucchini, squash  [place] burgers: made with red onions, mint, dill, lemon juice, feta cheese topped with roasted red peppers Vegetarian Cucumber salad: cucumbers, artichoke hearts, celery, red onion, feta cheese, tossed in olive oil & lemon juice  Hummus and whole grain pita points with a greek salad (lettuce, tomato, feta, olives, cucumbers, red onion) Lentil soup with celery, carrots made with vegetable broth, garlic, salt and pepper  Tabouli salad: parsley, bulgur, mint, scallions, cucumbers, tomato, radishes, lemon juice, olive oil, salt and pepper.

## 2021-02-24 DIAGNOSIS — H524 Presbyopia: Secondary | ICD-10-CM | POA: Diagnosis not present

## 2021-06-08 ENCOUNTER — Other Ambulatory Visit: Payer: Self-pay

## 2021-06-08 ENCOUNTER — Ambulatory Visit (INDEPENDENT_AMBULATORY_CARE_PROVIDER_SITE_OTHER): Payer: Medicare HMO | Admitting: Psychology

## 2021-06-08 ENCOUNTER — Ambulatory Visit: Payer: Medicare HMO | Admitting: Psychology

## 2021-06-08 ENCOUNTER — Encounter: Payer: Self-pay | Admitting: Psychology

## 2021-06-08 DIAGNOSIS — R4189 Other symptoms and signs involving cognitive functions and awareness: Secondary | ICD-10-CM

## 2021-06-08 DIAGNOSIS — G309 Alzheimer's disease, unspecified: Secondary | ICD-10-CM | POA: Diagnosis not present

## 2021-06-08 DIAGNOSIS — F0280 Dementia in other diseases classified elsewhere without behavioral disturbance: Secondary | ICD-10-CM | POA: Diagnosis not present

## 2021-06-08 NOTE — Progress Notes (Signed)
° °  Psychometrician Note   Cognitive testing was administered to Randy Bean by Wallace Keller, B.S. (psychometrist) under the supervision of Dr. Newman Nickels, Ph.D., licensed psychologist on 06/08/21. Randy Bean did not appear overtly distressed by the testing session per behavioral observation or responses across self-report questionnaires. Rest breaks were offered.    The battery of tests administered was selected by Dr. Newman Nickels, Ph.D. with consideration to Randy Bean current level of functioning, the nature of his symptoms, emotional and behavioral responses during interview, level of literacy, observed level of motivation/effort, and the nature of the referral question. This battery was communicated to the psychometrist. Communication between Dr. Newman Nickels, Ph.D. and the psychometrist was ongoing throughout the evaluation and Dr. Newman Nickels, Ph.D. was immediately accessible at all times. Dr. Newman Nickels, Ph.D. provided supervision to the psychometrist on the date of this service to the extent necessary to assure the quality of all services provided.    Randy Bean will return within approximately 1-2 weeks for an interactive feedback session with Dr. Milbert Coulter at which time his test performances, clinical impressions, and treatment recommendations will be reviewed in detail. Randy Bean understands he can contact our office should he require our assistance before this time.  A total of 140 minutes of billable time were spent face-to-face with Randy Bean by the psychometrist. This includes both test administration and scoring time. Billing for these services is reflected in the clinical report generated by Dr. Newman Nickels, Ph.D.  This note reflects time spent with the psychometrician and does not include test scores or any clinical interpretations made by Dr. Milbert Coulter. The full report will follow in a separate note.

## 2021-06-08 NOTE — Progress Notes (Signed)
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 Exceptionally Low    Delayed Recall 1/17 (19) 0/17 (19) <1 Exceptionally Low    Retention Percentage 11 (<19) 0 (<19) <1 Exceptionally Low    Recognition Hits 1/10 (<19) 1/10 (<19) <1 Exceptionally Low        Attention/Executive Function:      Trail Making Test (TMT): Raw Score (T Score) Raw Score (T Score) Percentile     Part A 62 secs.,  0 errors (35) 84 secs.,  0 errors (21) <1 Exceptionally Low    Part B Discontinued Discontinued --- Impaired          Scaled Score Scaled Score Percentile   WAIS-IV Coding: 3 1 <1 Exceptionally Low        NAB Attention Module, Form 1: T Score T Score Percentile     Digits Forward 40 34 5 Well Below Average    Digits Backwards 37 37 9 Below Average         Scaled Score Scaled Score Percentile   WAIS-IV Similarities: --- 8 25 Average        D-KEFS Color-Word Interference Test: Raw Score (Scaled Score) Raw Score (Scaled Score) Percentile     Color Naming 57 secs. (1) 76 secs. (1) <1 Exceptionally Low    Word Reading 27  secs. (9) 37 secs. (4) 2 Well Below Average    Inhibition 151 secs. (1) Discontinued --- Impaired    Inhibition/Switching Discontinued Discontinued --- Impaired        [place]consin Card Sorting Test: Raw Score Raw Score Percentile     Categories (trials) 0 (64) --- --- ---    Total Errors 28 (18%) --- --- ---    Perseverative Errors 13 (38%) --- --- ---    Non-Perseverative Errors 15 (12%) --- --- ---    Failure to Maintain Set 0 --- --- ---        NAB Executive Functions Module, Form 1: T Score T Score Percentile     Judgment --- 61 86 Above Average        Language:      Verbal Fluency Test: Raw Score (T Score) Raw  Score (T Score) Percentile     Phonemic Fluency (FAS) 14 (25) 11 (22) <1 Exceptionally Low    Animal Fluency 16 (45) 12 (36) 8 Well Below Average         NAB Language Module, Form 1: T Score T Score Percentile     Auditory Comprehension 57 55 69 Average    Naming 31/31 (58) 29/31 (46) 34 Average        Visuospatial/Visuoconstruction:       Raw Score Raw Score Percentile   Clock Drawing: 4/10 4/10 --- Impaired        NAB Spatial Module, Form 1: T Score T Score Percentile     Figure Drawing Copy 56 44 27 Average         Scaled Score Scaled Score Percentile   WAIS-IV Block Design: 3 2 <1 Exceptionally Low        Mood and Personality:       Raw Score Raw Score Percentile   Geriatric Depression Scale: 2 3 --- Within Normal Limits  Geriatric Anxiety Scale: 4 1 --- Minimal    Somatic 3 1 --- Minimal    Cognitive 0 0 --- Minimal    Affective 1 0 --- Minimal        Additional Questionnaires:       Raw Score Raw Score Percentile   PROMIS Sleep Disturbance Questionnaire: 9 9 --- None to Slight   Informed Consent and Coding/Compliance:   The current evaluation represents a clinical evaluation for the purposes previously outlined by the referral source and is in no way reflective of a forensic evaluation.   Mr. Legarda was provided with a verbal description of the  nature and purpose of the present neuropsychological evaluation. Also reviewed were the foreseeable risks and/or discomforts and benefits of the procedure, limits of confidentiality, and mandatory reporting requirements of this provider. The patient was given the opportunity to ask questions and receive answers about the evaluation. Oral consent to participate was provided by the patient.   This evaluation was conducted by Christia Reading, Ph.D., ABPP-CN, board certified clinical neuropsychologist. Mr. Cappell completed a clinical interview with Dr. Melvyn Novas, billed as one unit [phone], and 140 minutes of cognitive testing and scoring, billed as one unit [phone] and four additional units [id number]. Psychometrist Milana Kidney, B.S., assisted Dr. Melvyn Novas with test administration and scoring procedures. As a separate and discrete service, Dr. Melvyn Novas spent a total of 160 minutes in interpretation and report writing billed as one unit [phone] and two units [id number].

## 2021-06-15 ENCOUNTER — Other Ambulatory Visit: Payer: Self-pay

## 2021-06-15 ENCOUNTER — Ambulatory Visit (INDEPENDENT_AMBULATORY_CARE_PROVIDER_SITE_OTHER): Payer: Medicare HMO | Admitting: Psychology

## 2021-06-15 DIAGNOSIS — G309 Alzheimer's disease, unspecified: Secondary | ICD-10-CM | POA: Diagnosis not present

## 2021-06-15 DIAGNOSIS — F0280 Dementia in other diseases classified elsewhere without behavioral disturbance: Secondary | ICD-10-CM

## 2021-06-15 NOTE — Progress Notes (Signed)
° °  Neuropsychology Feedback Session Randy Bean. [facility] Department of Neurology  Reason for Referral:   Randy Bean is a 71 y.o. right-handed Caucasian male referred by [name], M.D., to characterize his current cognitive functioning and assist with diagnostic clarity and treatment planning in the context of a prior diagnosis of a mild neurocognitive disorder with concerns for Alzheimer's disease, as well as continued cognitive decline.   Feedback:   Randy Bean completed a comprehensive neuropsychological evaluation on 06/08/2021. Please refer to that encounter for the full report and recommendations. Briefly, results suggested severe impairment surrounding all aspects of learning and memory. Additional impairments were exhibited across processing speed, executive functioning, and verbal fluency (both phonemic and semantic). Some variability was exhibited across both attention/concentration and visuospatial abilities. Regarding etiology, Alzheimer's disease continues to represent the most likely cause. Across memory testing, he was fully amnestic (i.e., 0% retention) across all tasks and performed very poorly across yes/no recognition trials. This suggests the presence of rapid forgetting and a severe memory storage deficit, both of which represent hallmark characteristics of this illness. Objective decline seen in confrontation naming, semantic fluency, and visuospatial abilities over the past 12-18 months also follows traditional disease trajectory.  Randy Bean was accompanied by his wife during the current feedback session. Content of the current session focused on the results of his neuropsychological evaluation. Randy Bean was given the opportunity to ask questions and his questions were answered. He was encouraged to reach out should additional questions arise. A copy of his report was provided at the conclusion of the visit.      30 minutes were spent conducting  the current feedback session with Randy Bean, billed as one unit [phone].

## 2021-06-30 ENCOUNTER — Ambulatory Visit (INDEPENDENT_AMBULATORY_CARE_PROVIDER_SITE_OTHER): Payer: Medicare HMO

## 2021-06-30 VITALS — Ht 66.0 in | Wt 158.0 lb

## 2021-06-30 DIAGNOSIS — Z0000 Encounter for general adult medical examination without abnormal findings: Secondary | ICD-10-CM | POA: Diagnosis not present

## 2021-06-30 NOTE — Patient Instructions (Signed)
[name] , Thank you for taking time to come for your Medicare Wellness Visit. I appreciate your ongoing commitment to your health goals. Please review the following plan we discussed and let me know if I can assist you in the future.   Screening recommendations/referrals: Colonoscopy: completed 04/04/2015, due 04/03/2025 Recommended yearly ophthalmology/optometry visit for glaucoma screening and checkup Recommended yearly dental visit for hygiene and checkup  Vaccinations: Influenza vaccine: completed 02/05/2021, due next flu season Pneumococcal vaccine: completed 02/28/2019 Tdap vaccine: due now Shingles vaccine: completed   Covid-19:  03/03/2021, 10/01/2020, 03/03/2020, 07/28/2019, 07/05/2019  Advanced directives: copy in chart  Conditions/risks identified: none  Next appointment: Follow up in one year for your annual wellness visit.   Preventive Care 28 Years and Older, Male Preventive care refers to lifestyle choices and visits with your health care provider that can promote health and wellness. What does preventive care include? A yearly physical exam. This is also called an annual well check. Dental exams once or twice a year. Routine eye exams. Ask your health care provider how often you should have your eyes checked. Personal lifestyle choices, including: Daily care of your teeth and gums. Regular physical activity. Eating a healthy diet. Avoiding tobacco and drug use. Limiting alcohol use. Practicing safe sex. Taking low doses of aspirin every day. Taking vitamin and mineral supplements as recommended by your health care provider. What happens during an annual well check? The services and screenings done by your health care provider during your annual well check will depend on your age, overall health, lifestyle risk factors, and family history of disease. Counseling  Your health care provider may ask you questions about your: Alcohol use. Tobacco use. Drug  use. Emotional well-being. Home and relationship well-being. Sexual activity. Eating habits. History of falls. Memory and ability to understand (cognition). Work and work Astronomer. Screening  You may have the following tests or measurements: Height, weight, and BMI. Blood pressure. Lipid and cholesterol levels. These may be checked every 5 years, or more frequently if you are over 45 years old. Skin check. Lung cancer screening. You may have this screening every year starting at age 17 if you have a 30-pack-year history of smoking and currently smoke or have quit within the past 15 years. Fecal occult blood test (FOBT) of the stool. You may have this test every year starting at age 49. Flexible sigmoidoscopy or colonoscopy. You may have a sigmoidoscopy every 5 years or a colonoscopy every 10 years starting at age 64. Prostate cancer screening. Recommendations will vary depending on your family history and other risks. Hepatitis C blood test. Hepatitis B blood test. Sexually transmitted disease (STD) testing. Diabetes screening. This is done by checking your blood sugar (glucose) after you have not eaten for a while (fasting). You may have this done every 1-3 years. Abdominal aortic aneurysm (AAA) screening. You may need this if you are a current or former smoker. Osteoporosis. You may be screened starting at age 18 if you are at high risk. Talk with your health care provider about your test results, treatment options, and if necessary, the need for more tests. Vaccines  Your health care provider may recommend certain vaccines, such as: Influenza vaccine. This is recommended every year. Tetanus, diphtheria, and acellular pertussis (Tdap, Td) vaccine. You may need a Td booster every 10 years. Zoster vaccine. You may need this after age 55. Pneumococcal 13-valent conjugate (PCV13) vaccine. One dose is recommended after age 9. Pneumococcal polysaccharide (PPSV23) vaccine. One dose  is  recommended after age 3. Talk to your health care provider about which screenings and vaccines you need and how often you need them. This information is not intended to replace advice given to you by your health care provider. Make sure you discuss any questions you have with your health care provider. Document Released: 06/06/2015 Document Revised: 01/28/2016 Document Reviewed: 03/11/2015 Elsevier Interactive Patient Education  2017 [place] Prevention in the Home Falls can cause injuries. They can happen to people of all ages. There are many things you can do to make your home safe and to help prevent falls. What can I do on the outside of my home? Regularly fix the edges of walkways and driveways and fix any cracks. Remove anything that might make you trip as you walk through a door, such as a raised step or threshold. Trim any bushes or trees on the path to your home. Use bright outdoor lighting. Clear any walking paths of anything that might make someone trip, such as rocks or tools. Regularly check to see if handrails are loose or broken. Make sure that both sides of any steps have handrails. Any raised decks and porches should have guardrails on the edges. Have any leaves, snow, or ice cleared regularly. Use sand or salt on walking paths during winter. Clean up any spills in your garage right away. This includes oil or grease spills. What can I do in the bathroom? Use night lights. Install grab bars by the toilet and in the tub and shower. Do not use towel bars as grab bars. Use non-skid mats or decals in the tub or shower. If you need to sit down in the shower, use a plastic, non-slip stool. Keep the floor dry. Clean up any water that spills on the floor as soon as it happens. Remove soap buildup in the tub or shower regularly. Attach bath mats securely with double-sided non-slip rug tape. Do not have throw rugs and other things on the floor that can make you  trip. What can I do in the bedroom? Use night lights. Make sure that you have a light by your bed that is easy to reach. Do not use any sheets or blankets that are too big for your bed. They should not hang down onto the floor. Have a firm chair that has side arms. You can use this for support while you get dressed. Do not have throw rugs and other things on the floor that can make you trip. What can I do in the kitchen? Clean up any spills right away. Avoid walking on wet floors. Keep items that you use a lot in easy-to-reach places. If you need to reach something above you, use a strong step stool that has a grab bar. Keep electrical cords out of the way. Do not use floor polish or wax that makes floors slippery. If you must use wax, use non-skid floor wax. Do not have throw rugs and other things on the floor that can make you trip. What can I do with my stairs? Do not leave any items on the stairs. Make sure that there are handrails on both sides of the stairs and use them. Fix handrails that are broken or loose. Make sure that handrails are as long as the stairways. Check any carpeting to make sure that it is firmly attached to the stairs. Fix any carpet that is loose or worn. Avoid having throw rugs at the top or bottom of the stairs.  If you do have throw rugs, attach them to the floor with carpet tape. Make sure that you have a light switch at the top of the stairs and the bottom of the stairs. If you do not have them, ask someone to add them for you. What else can I do to help prevent falls? Wear shoes that: Do not have high heels. Have rubber bottoms. Are comfortable and fit you well. Are closed at the toe. Do not wear sandals. If you use a stepladder: Make sure that it is fully opened. Do not climb a closed stepladder. Make sure that both sides of the stepladder are locked into place. Ask someone to hold it for you, if possible. Clearly mark and make sure that you can  see: Any grab bars or handrails. First and last steps. Where the edge of each step is. Use tools that help you move around (mobility aids) if they are needed. These include: Canes. Walkers. Scooters. Crutches. Turn on the lights when you go into a dark area. Replace any light bulbs as soon as they burn out. Set up your furniture so you have a clear path. Avoid moving your furniture around. If any of your floors are uneven, fix them. If there are any pets around you, be aware of where they are. Review your medicines with your doctor. Some medicines can make you feel dizzy. This can increase your chance of falling. Ask your doctor what other things that you can do to help prevent falls. This information is not intended to replace advice given to you by your health care provider. Make sure you discuss any questions you have with your health care provider. Document Released: 03/06/2009 Document Revised: 10/16/2015 Document Reviewed: 06/14/2014 Elsevier Interactive Patient Education  2017 [employer].

## 2021-06-30 NOTE — Progress Notes (Addendum)
I connected with Randy Bean today by telephone and verified that I am speaking with the correct person using two identifiers. Location patient: home Location provider: work Persons participating in the virtual visit: Randy Bean, Randy Bean (wife), Elisha Ponder LPN.   I discussed the limitations, risks, security and privacy concerns of performing an evaluation and management service by telephone and the availability of in person appointments. I also discussed with the patient that there may be a patient responsible charge related to this service. The patient expressed understanding and verbally consented to this telephonic visit.    Interactive audio and video telecommunications were attempted between this provider and patient, however failed, due to patient having technical difficulties OR patient did not have access to video capability.  We continued and completed visit with audio only.     Vital signs may be patient reported or missing.  Subjective:   Randy Bean is a 71 y.o. male who presents for Medicare Annual/Subsequent preventive examination.  Review of Systems     Cardiac Risk Factors include: advanced age (>60men, >36 women);dyslipidemia;male gender     Objective:    Today's Vitals   06/30/21 1327  Weight: 158 lb (71.7 kg)  Height: 5\' 6"  (1.676 m)   Body mass index is 25.5 kg/m.  Advanced Directives 06/30/2021 01/12/2021 07/24/2020 10/08/2019  Does Patient Have a Medical Advance Directive? Yes Yes Yes Yes  Type of Estate agent of [place];Living will Healthcare Power of [place];Out of facility DNR (pink MOST or yellow form) Healthcare Power of [employer];Living will -  Does patient want to make changes to medical advance directive? - - No - Patient declined -  Copy of Healthcare Power of Attorney in Chart? - - No - copy requested -    Current Medications (verified) Outpatient Encounter Medications as of 06/30/2021  Medication Sig    aspirin 325 MG tablet Take 325 mg by mouth daily.   Coenzyme Q10 (COQ10) 100 MG CAPS Take 1 capsule by mouth daily.   donepezil (ARICEPT) 10 MG tablet Take 1 tablet daily   ezetimibe (ZETIA) 10 MG tablet TAKE ONE TABLET BY MOUTH ONE TIME DAILY   Flax OIL Take 1 tablet by mouth daily.   Garlic 1000 MG CAPS Take 1 capsule by mouth daily.   Levocetirizine Dihydrochloride (XYZAL PO) Take by mouth.   Omega 3 1200 MG CAPS Take 1 capsule by mouth daily.   sertraline (ZOLOFT) 50 MG tablet Take 1 tablet by mouth once daily   simvastatin (ZOCOR) 20 MG tablet Take 1 tablet (20 mg total) by mouth daily.   vitamin C (ASCORBIC ACID) 500 MG tablet Take 500 mg by mouth daily.   No facility-administered encounter medications on file as of 06/30/2021.    Allergies (verified) Patient has no known allergies.   History: Past Medical History:  Diagnosis Date   Allergy    Dementia due to Alzheimer's disease    Generalized anxiety disorder 03/31/2016   History of head injury    Hyperlipemia, mixed 03/31/2016   Major depressive disorder    History reviewed. No pertinent surgical history. Family History  Problem Relation Age of Onset   Mental illness Mother    Memory loss Mother    COPD Father    Memory loss Brother        Older half-brother   Social History   Socioeconomic History   Marital status: Married    Spouse name: Not on file   Number of children: Not on file  Years of education: 5413   Highest education level: Some college, no degree  Occupational History   Occupation: retired  Tobacco Use   Smoking status: Former    Passive exposure: Past   Smokeless tobacco: Former    Types: Associate ProfessorChew  Vaping Use   Vaping Use: Never used  Substance and Sexual Activity   Alcohol use: Yes    Alcohol/week: 7.0 standard drinks    Types: 7 Glasses of wine per week    Comment: 1 glass every evening   Drug use: Never   Sexual activity: Yes  Other Topics Concern   Not on file  Social History  Narrative   Right handed   One story home   Lives with wife   Social Determinants of Health   Financial Resource Strain: Low Risk    Difficulty of Paying Living Expenses: Not hard at all  Food Insecurity: No Food Insecurity   Worried About Programme researcher, broadcasting/film/videounning Out of Food in the Last Year: Never true   Ran Out of Food in the Last Year: Never true  Transportation Needs: No Transportation Needs   Lack of Transportation (Medical): No   Lack of Transportation (Non-Medical): No  Physical Activity: Sufficiently Active   Days of Exercise per Week: 5 days   Minutes of Exercise per Session: 30 min  Stress: No Stress Concern Present   Feeling of Stress : Not at all  Social Connections: Socially Integrated   Frequency of Communication with Friends and Family: More than three times a week   Frequency of Social Gatherings with Friends and Family: More than three times a week   Attends Religious Services: More than 4 times per year   Active Member of [employer] Financial[employer]ubs or Organizations: Yes   Attends Engineer, structuralClub or Organization Meetings: More than 4 times per year   Marital Status: Married    Tobacco Counseling Counseling given: Not Answered   Clinical Intake:  Pre-visit preparation completed: Yes  Pain : No/denies pain     Nutritional Status: BMI 25 -29 Overweight Nutritional Risks: None Diabetes: No  What is the last grade level you completed in school?: some college  Diabetic? no  Interpreter Needed?: No  Information entered by :: NAllen LPN   Activities of Daily Living In your present state of health, do you have any difficulty performing the following activities: 06/30/2021 07/24/2020  Hearing? N N  Vision? N N  Difficulty concentrating or making decisions? Malvin JohnsY Y  Walking or climbing stairs? N N  Dressing or bathing? N N  Doing errands, shopping? Y N  Preparing Food and eating ? N N  Using the Toilet? N N  In the past six months, have you accidently leaked urine? N N  Do you have problems with loss  of bowel control? N N  Managing your Medications? Y N  Managing your Finances? Y N  Housekeeping or managing your Housekeeping? Y N  Some recent data might be hidden    Patient Care Team: [place], Betty G, MD as PCP - General (Family Medicine) Van ClinesAquino, Karen M, MD as Consulting Physician (Neurology)  Indicate any recent Medical Services you may have received from other than [facility] providers in the past year (date may be approximate).     Assessment:   This is a routine wellness examination for Molly MaduroRobert.  Hearing/Vision screen Vision Screening - Comments:: Regular eye exams, Dr. Virginia Rochesterrr  Dietary issues and exercise activities discussed: Current Exercise Habits: Home exercise routine, Type of exercise: walking, Time (Minutes): 30, Frequency (  Times/Week): 5, Weekly Exercise (Minutes/Week): 150   Goals Addressed             This Visit's Progress    Patient Stated       06/30/2021, work on drinking water       Depression Screen PHQ 2/9 Scores 06/30/2021 07/24/2020 10/08/2019 07/12/2019 06/07/2018 04/01/2017  PHQ - 2 Score 0 0 0 0 0 0    Fall Risk Fall Risk  06/30/2021 01/12/2021 07/24/2020 10/08/2019 07/12/2019  Falls in the past year? 0 - 0 0 0  Number falls in past yr: - 0 0 0 0  Injury with Fall? - 0 0 0 0  Risk for fall due to : Medication side effect - No Fall Risks - -  Follow up Falls evaluation completed;Education provided;Falls prevention discussed - Falls evaluation completed;Falls prevention discussed - Education provided    FALL RISK PREVENTION PERTAINING TO THE HOME:  Any stairs in or around the home? Yes  If so, are there any without handrails? No  Home free of loose throw rugs in walkways, pet beds, electrical cords, etc? Yes  Adequate lighting in your home to reduce risk of falls? Yes   ASSISTIVE DEVICES UTILIZED TO PREVENT FALLS:  Life alert? No  Use of a cane, walker or w/c? No  Grab bars in the bathroom? No  Shower chair or bench in shower? Yes  Elevated toilet seat  or a handicapped toilet? No   TIMED UP AND GO:  Was the test performed? No .      Cognitive Function: MMSE - Mini Mental State Exam 01/12/2021  Orientation to time 2  Orientation to Place 4  Registration 3  Attention/ Calculation 4  Recall 0  Language- name 2 objects 2  Language- repeat 1  Language- follow 3 step command 3  Language- read & follow direction 1  Write a sentence 1  Copy design 1  Total score 22        Immunizations Immunization History  Administered Date(s) Administered   Fluad Quad(high Dose 65+) 02/05/2021   Hepatitis A 07/15/1994, 01/15/1995   Hepatitis B 07/15/1994, 09/15/1994, 01/15/1995   Influenza, High Dose Seasonal PF 03/31/2016, 02/09/2018, 01/30/2019, 02/25/2020   Influenza-Unspecified 03/21/2017, 02/09/2018   Meningococcal Conjugate 07/15/1994   Moderna Covid-19 Vaccine Bivalent Booster 24yrs & up 03/03/2021   PFIZER(Purple Top)SARS-COV-2 Vaccination 07/05/2019, 07/28/2019, 03/03/2020, 10/01/2020   Pneumococcal Conjugate-13 02/28/2019   Pneumococcal Polysaccharide-23 04/01/2017   Tdap 02/27/2007   Zoster Recombinat (Shingrix) 12/09/2017, 12/09/2017, 02/09/2018   Zoster, Live 02/12/2014    TDAP status: Up to date  Flu Vaccine status: Up to date  Pneumococcal vaccine status: Up to date  Covid-19 vaccine status: Completed vaccines  Qualifies for Shingles Vaccine? Yes   Zostavax completed Yes   Shingrix Completed?: Yes  Screening Tests Health Maintenance  Topic Date Due   TETANUS/TDAP  02/26/2017   COLONOSCOPY (Pts 45-9yrs Insurance coverage will need to be confirmed)  05/24/2024   Pneumonia Vaccine 34+ Years old  Completed   INFLUENZA VACCINE  Completed   COVID-19 Vaccine  Completed   Hepatitis C Screening  Completed   Zoster Vaccines- Shingrix  Completed   HPV VACCINES  Aged Out    Health Maintenance  Health Maintenance Due  Topic Date Due   TETANUS/TDAP  02/26/2017    Colorectal cancer screening: Type of screening:  Colonoscopy. Completed 05/24/2014. Repeat every 10 years  Lung Cancer Screening: (Low Dose CT Chest recommended if Age 58-80 years, 30 pack-year currently smoking  OR have quit w/in 15years.) does not qualify.   Lung Cancer Screening Referral: no  Additional Screening:  Hepatitis C Screening: does qualify; Completed 04/01/2017  Vision Screening: Recommended annual ophthalmology exams for early detection of glaucoma and other disorders of the eye. Is the patient up to date with their annual eye exam?  Yes  Who is the provider or what is the name of the office in which the patient attends annual eye exams? Dr. Virginia Rochester If pt is not established with a provider, would they like to be referred to a provider to establish care? No .   Dental Screening: Recommended annual dental exams for proper oral hygiene  Community Resource Referral / Chronic Care Management: CRR required this visit?  No   CCM required this visit?  No      Plan:     I have personally reviewed and noted the following in the patients chart:   Medical and social history Use of alcohol, tobacco or illicit drugs  Current medications and supplements including opioid prescriptions. Patient is not currently taking opioid prescriptions. Functional ability and status Nutritional status Physical activity Advanced directives List of other physicians Hospitalizations, surgeries, and ER visits in previous 12 months Vitals Screenings to include cognitive, depression, and falls Referrals and appointments  In addition, I have reviewed and discussed with patient certain preventive protocols, quality metrics, and best practice recommendations. A written personalized care plan for preventive services as well as general preventive health recommendations were provided to patient.     Barb Merino, LPN   12/22/2749   Nurse Notes: 6 CIT not administered. Patient has diagnosis of Alzheimer's. He is followed by neurology.  Due to this  being a virtual visit, the after visit summary with patients personalized plan was offered to patient via mail or my-chart. Patient declined at this time.

## 2021-07-21 ENCOUNTER — Telehealth: Payer: Self-pay | Admitting: Neurology

## 2021-07-21 IMAGING — MR MR HEAD WO/W CM
11 of 12 series · 41 of 48 positions shown · IV contrast (multihance)
Comparison: None.

CLINICAL DATA: Memory loss and visual disturbance.

EXAM:
MRI HEAD WITHOUT AND WITH CONTRAST
TECHNIQUE: Multiplanar, multiecho pulse sequences of the brain and surrounding
structures were obtained without and with intravenous contrast.
CONTRAST:  15mL MULTIHANCE GADOBENATE DIMEGLUMINE 529 MG/ML IV SOLN

[Series 2: T1 · sagittal · 5.0mm · 0.45mm/px · 1 of 21 slices shown]
[im 1/21]
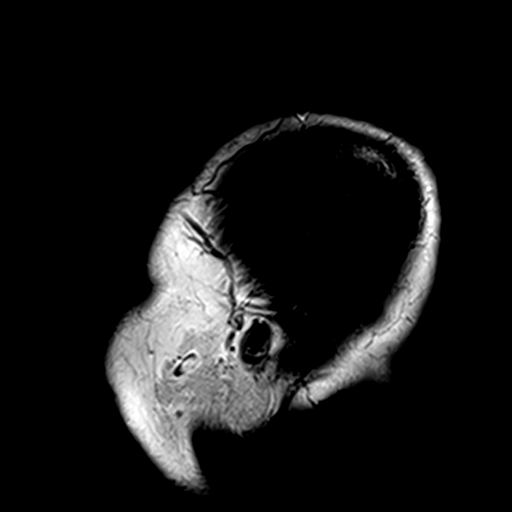

[Series 3: DWI · axial · 3.0mm · 1.80mm/px · z∈[-62,+85]mm · 6 of 99 slices shown (1 of 4)]
[im 1/99]
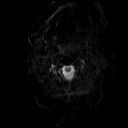
[im 20/99]
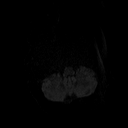
[im 40/99]
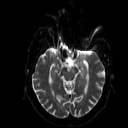
[im 59/99]
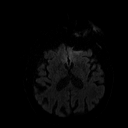
[im 79/99]
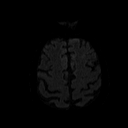
[im 99/99]
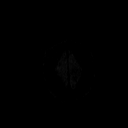

[Series 4: DWI · axial · 3.0mm · 1.80mm/px · z∈[-62,+85]mm · 3 of 47 slices shown (2 of 4)]
[im 1/47]
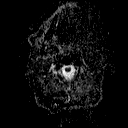
[im 24/47]
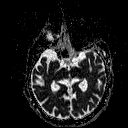
[im 47/47]
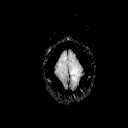

[Series 6: swi_images · axial · 2.0mm · 0.90mm/px · z∈[-68,+90]mm · 5 of 80 slices shown]
[im 1/80]
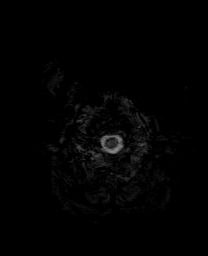
[im 20/80]
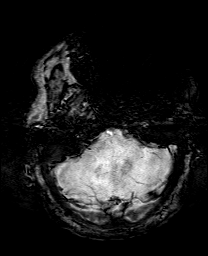
[im 40/80]
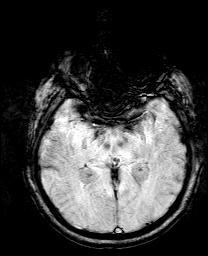
[im 60/80]
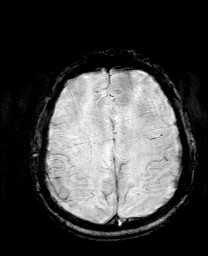
[im 80/80]
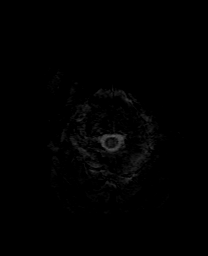

[Series 7: DWI · coronal · 5.0mm · 1.80mm/px · 4 of 60 slices shown (3 of 4)]
[im 1/60]
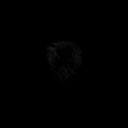
[im 20/60]
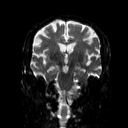
[im 40/60]
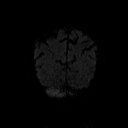
[im 60/60]
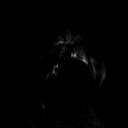

[Series 8: DWI · coronal · 5.0mm · 1.80mm/px · 2 of 34 slices shown (4 of 4)]
[im 1/34]
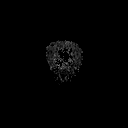
[im 34/34]
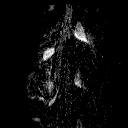

[Series 9: T2 · axial · 5.0mm · 0.72mm/px · 1 of 23 slices shown (1 of 2)]
[im 1/23]
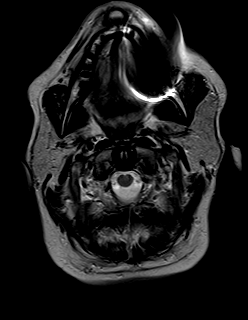

[Series 10: FLAIR · axial · 3.0mm · 0.45mm/px · z∈[-67,+89]mm · 2 of 27 slices shown]
[im 1/27]
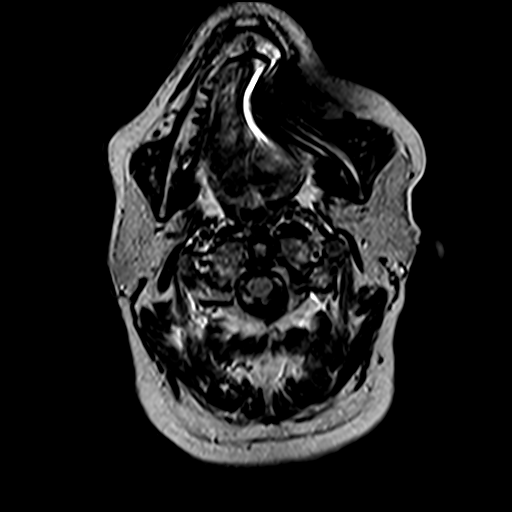
[im 27/27]
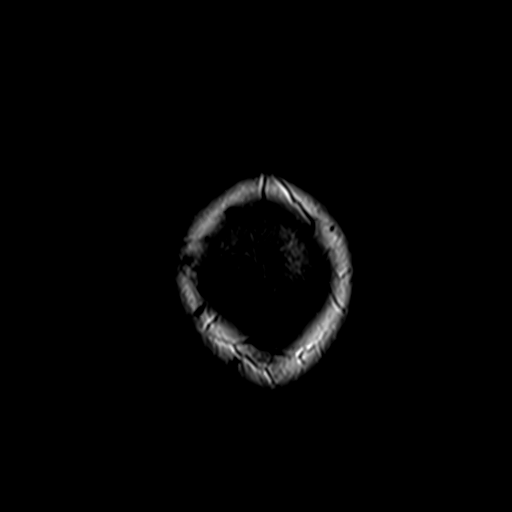

[Series 11: t1_mpr_tra copy center · axial · 1.0mm · 0.45mm/px · z∈[-69,+90]mm · 8 of 160 slices shown]
[im 1/160]
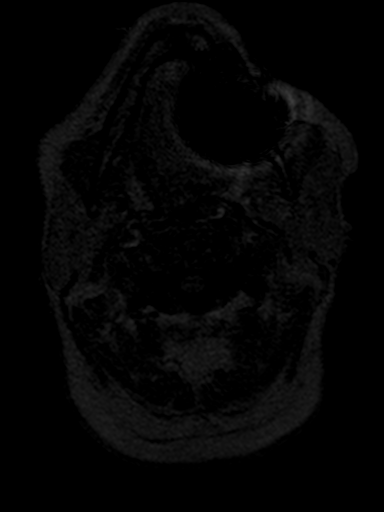
[im 18/160]
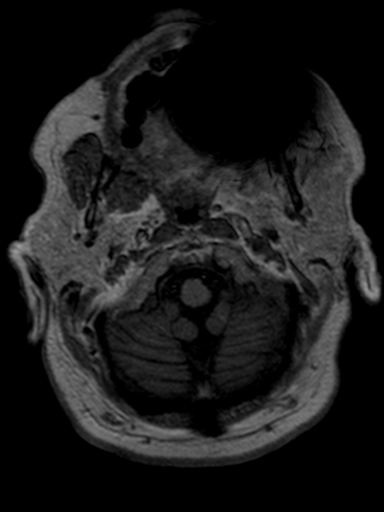
[im 54/160]
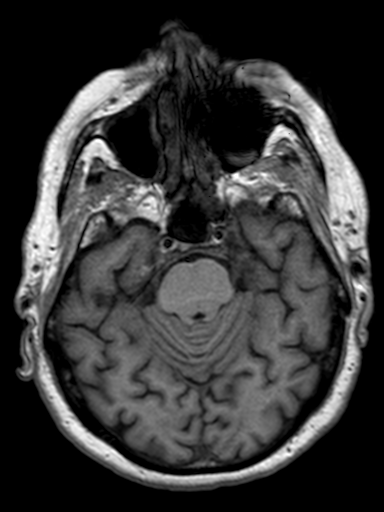
[im 71/160]
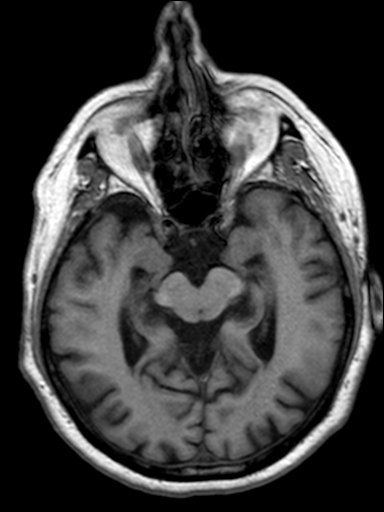
[im 89/160]
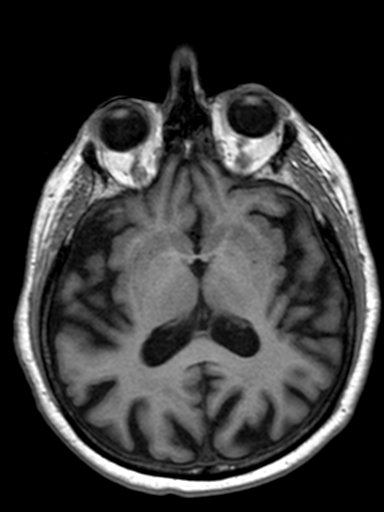
[im 107/160]
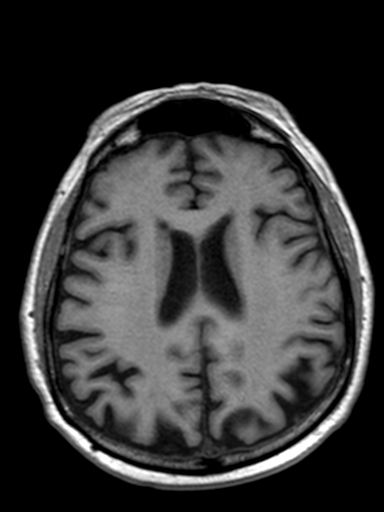
[im 142/160]
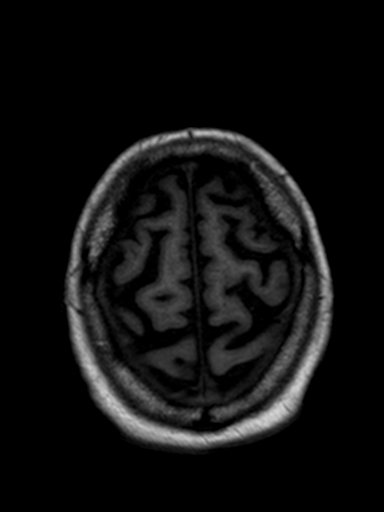
[im 160/160]
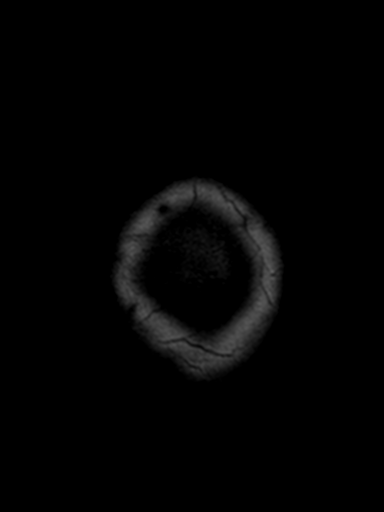

[Series 12: T2 · coronal · 5.0mm · 0.45mm/px · 2 of 26 slices shown (2 of 2)]
[im 1/26]
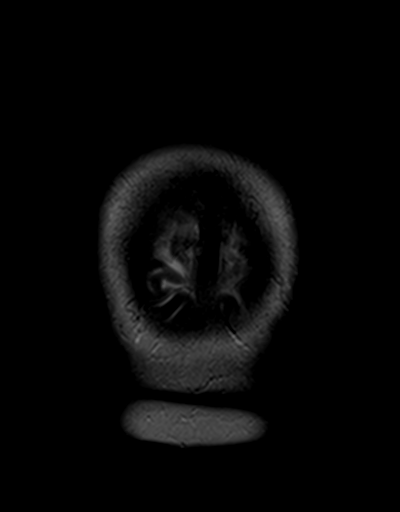
[im 26/26]
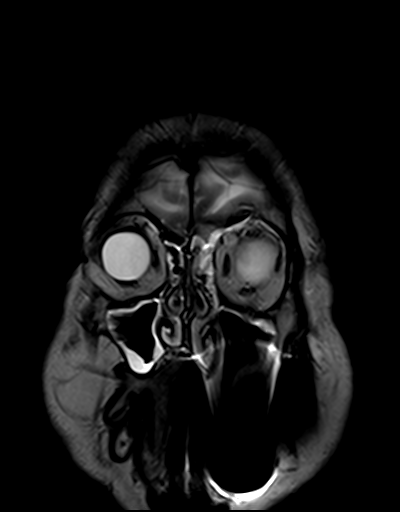

[Series 13: t1_mpr_tra · axial · 1.0mm · 0.45mm/px · z∈[-69,+72]mm · 7 of 160 slices shown]
[im 1/160]
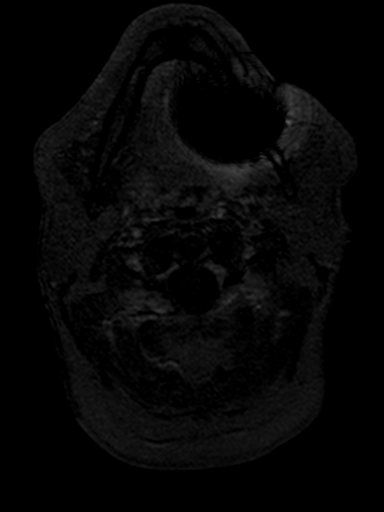
[im 18/160]
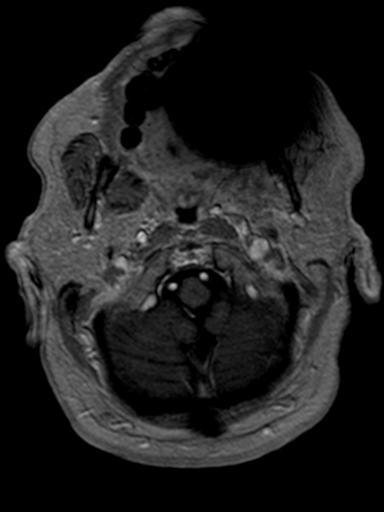
[im 54/160]
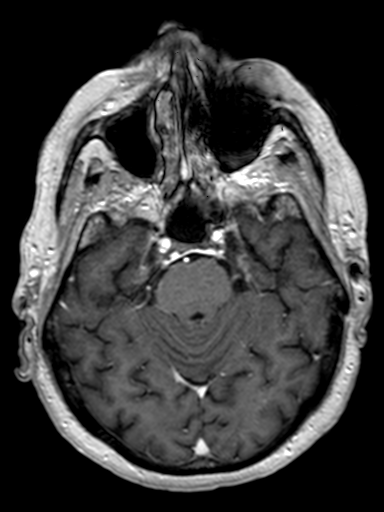
[im 71/160]
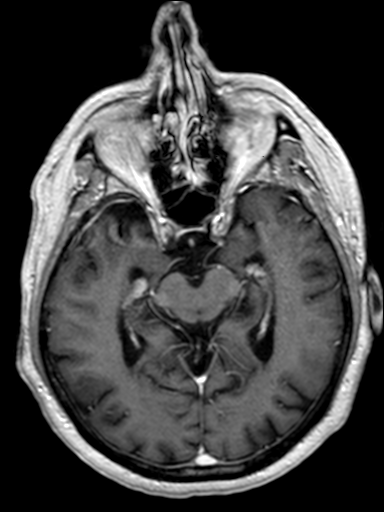
[im 89/160]
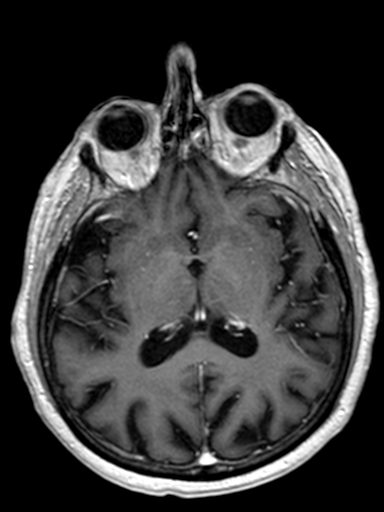
[im 107/160]
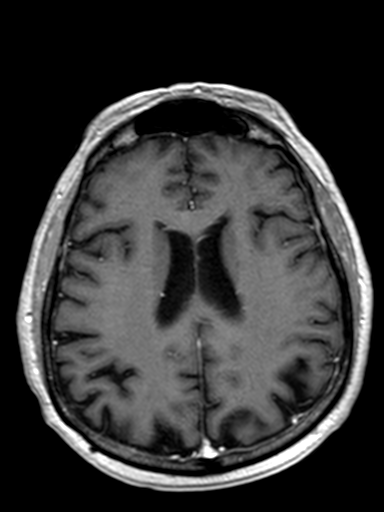
[im 142/160]
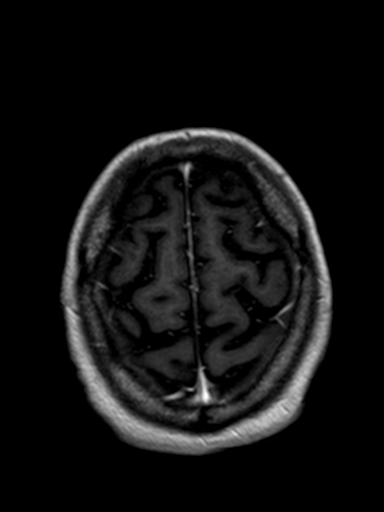

[41 of 48 positions shown; findings below may reference images not displayed]

FINDINGS: Brain: There is no evidence of acute infarct, intracranial
hemorrhage, mass, midline shift, or extra-axial fluid collection. A
2.5 cm focus of asymmetric extra-axial CSF anteriorly in the right
middle cranial fossa is compatible with an incidental arachnoid
cyst. The brain itself is normal in signal. No abnormal enhancement
is identified. There is mild cerebral atrophy without significant
asymmetry or lobar predilection.

Vascular: Major intracranial vascular flow voids are preserved.

Skull and upper cervical spine: Unremarkable bone marrow signal.

Sinuses/Orbits: Unremarkable orbits. Mild mucosal thickening in the
paranasal sinuses. Clear mastoid air cells.

Other: None.
IMPRESSION: 1. No acute intracranial abnormality or mass.
2. Mild generalized cerebral atrophy.

## 2021-07-21 NOTE — Telephone Encounter (Signed)
That is fine, would take the higher dose regularly though, not on as needed basis. PCP has been prescribing Sertraline, pls have her call for increase in dose, thanks

## 2021-07-21 NOTE — Telephone Encounter (Signed)
Patients wife would like to know if bob can take more of his sertraline when he gets more anxious. She said its not all the time, but would like to know if he is able to. She spoke with his PCP, but they wanted aquino to ok it.

## 2021-07-21 NOTE — Telephone Encounter (Signed)
Spoke with pt wife an informed her that Dr Delice Lesch stated That is fine, would take the higher dose regularly though, not on as needed basis. PCP has been prescribing Sertraline, pls have her call for increase in dose, pt wife verbalized understanding will call PCP

## 2021-07-23 ENCOUNTER — Ambulatory Visit: Payer: Medicare HMO | Admitting: Neurology

## 2021-07-23 ENCOUNTER — Other Ambulatory Visit: Payer: Self-pay

## 2021-07-23 ENCOUNTER — Encounter: Payer: Self-pay | Admitting: Neurology

## 2021-07-23 VITALS — BP 154/74 | HR 78 | Ht 66.0 in | Wt 166.0 lb

## 2021-07-23 DIAGNOSIS — F0280 Dementia in other diseases classified elsewhere without behavioral disturbance: Secondary | ICD-10-CM

## 2021-07-23 DIAGNOSIS — G309 Alzheimer's disease, unspecified: Secondary | ICD-10-CM

## 2021-07-23 DIAGNOSIS — R69 Illness, unspecified: Secondary | ICD-10-CM | POA: Diagnosis not present

## 2021-07-23 MED ORDER — DONEPEZIL HCL 10 MG PO TABS: ORAL_TABLET | ORAL | 3 refills | Status: DC

## 2021-07-23 NOTE — Patient Instructions (Signed)
Always good to see you. Continue Donepezil 10mg  daily. Follow-up in 6-8 months, call for any changes ? ? ?FALL PRECAUTIONS: Be cautious when walking. Scan the area for obstacles that may increase the risk of trips and falls. When getting up in the mornings, sit up at the edge of the bed for a few minutes before getting out of bed. Consider elevating the bed at the head end to avoid drop of blood pressure when getting up. Walk always in a well-lit room (use night lights in the walls). Avoid area rugs or power cords from appliances in the middle of the walkways. Use a walker or a cane if necessary and consider physical therapy for balance exercise. Get your eyesight checked regularly. ? ?FINANCIAL OVERSIGHT: Supervision, especially oversight when making financial decisions or transactions is also recommended. ? ?HOME SAFETY: Consider the safety of the kitchen when operating appliances like stoves, microwave oven, and blender. Consider having supervision and share cooking responsibilities until no longer able to participate in those. Accidents with firearms and other hazards in the house should be identified and addressed as well. ? ?DRIVING: Regarding driving, in patients with progressive memory problems, driving will be impaired. We advise to have someone else do the driving if trouble finding directions or if minor accidents are reported. Independent driving assessment is available to determine safety of driving. ? ?ABILITY TO BE LEFT ALONE: If patient is unable to contact 911 operator, consider using LifeLine, or when the need is there, arrange for someone to stay with patients. Smoking is a fire hazard, consider supervision or cessation. Risk of wandering should be assessed by caregiver and if detected at any point, supervision and safe proof recommendations should be instituted. ? ?MEDICATION SUPERVISION: Inability to self-administer medication needs to be constantly addressed. Implement a mechanism to ensure  safe administration of the medications. ? ?RECOMMENDATIONS FOR ALL PATIENTS WITH MEMORY PROBLEMS: ?1. Continue to exercise (Recommend 30 minutes of walking everyday, or 3 hours every week) ?2. Increase social interactions - continue going to [place] and enjoy social gatherings with friends and family ?3. Eat healthy, avoid fried foods and eat more fruits and vegetables ?4. Maintain adequate blood pressure, blood sugar, and blood cholesterol level. Reducing the risk of stroke and cardiovascular disease also helps promoting better memory. ?5. Avoid stressful situations. Live a simple life and avoid aggravations. Organize your time and prepare for the next day in anticipation. ?6. Sleep well, avoid any interruptions of sleep and avoid any distractions in the bedroom that may interfere with adequate sleep quality ?7. Avoid sugar, avoid sweets as there is a strong link between excessive sugar intake, diabetes, and cognitive impairment ?We discussed the Mediterranean diet, which has been shown to help patients reduce the risk of progressive memory disorders and reduces cardiovascular risk. This includes eating fish, eat fruits and green leafy vegetables, nuts like almonds and hazelnuts, walnuts, and also use olive oil. Avoid fast foods and fried foods as much as possible. Avoid sweets and sugar as sugar use has been linked to worsening of memory function. ? ?There is always a concern of gradual progression of memory problems. If this is the case, then we may need to adjust level of care according to patient needs. Support, both to the patient and caregiver, should then be put into place. ? ?
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
## 2021-08-18 ENCOUNTER — Other Ambulatory Visit: Payer: Self-pay

## 2021-08-18 DIAGNOSIS — E785 Hyperlipidemia, unspecified: Secondary | ICD-10-CM

## 2021-08-18 MED ORDER — SIMVASTATIN 20 MG PO TABS: 20.0000 mg | ORAL_TABLET | Freq: Every day | ORAL | 1 refills | Status: DC

## 2021-08-31 ENCOUNTER — Other Ambulatory Visit: Payer: Self-pay | Admitting: Family Medicine

## 2021-08-31 DIAGNOSIS — F411 Generalized anxiety disorder: Secondary | ICD-10-CM

## 2021-09-02 NOTE — Progress Notes (Signed)
? ?HPI: ?Randy Bean is a 71 y.o.male here today with his wife for his routine physical examination. ? ?Last CPE: 09/01/20 ? ?Regular exercise: Walking a few times per week. ?Following a healthful diet: Home cooked meals mainly. ? ?Chronic medical problems: HLD, anxiety,allergies,and dementia among some. ? ?Immunization History  ?Administered Date(s) Administered  ? Fluad Quad(high Dose 65+) 02/05/2021  ? Hepatitis A 07/15/1994, 01/15/1995  ? Hepatitis B 07/15/1994, 09/15/1994, 01/15/1995  ? Influenza, High Dose Seasonal PF 03/31/2016, 02/09/2018, 01/30/2019, 02/25/2020  ? Influenza-Unspecified 03/21/2017, 02/09/2018  ? Meningococcal Conjugate 07/15/1994  ? Moderna Covid-19 Vaccine Bivalent Booster 63yrs & up 03/03/2021  ? PFIZER(Purple Top)SARS-COV-2 Vaccination 07/05/2019, 07/28/2019, 03/03/2020, 10/01/2020  ? Pneumococcal Conjugate-13 02/28/2019  ? Pneumococcal Polysaccharide-23 04/01/2017, 07/13/2021  ? Tdap 02/27/2007, 07/13/2021  ? Zoster Recombinat (Shingrix) 12/09/2017, 12/09/2017, 02/09/2018  ? Zoster, Live 02/12/2014  ? ? ?Health Maintenance  ?Topic Date Due  ? INFLUENZA VACCINE  12/22/2021  ? COLONOSCOPY (Pts 45-86yrs Insurance coverage will need to be confirmed)  05/24/2024  ? TETANUS/TDAP  07/14/2031  ? Pneumonia Vaccine 34+ Years old  Completed  ? COVID-19 Vaccine  Completed  ? Hepatitis C Screening  Completed  ? Zoster Vaccines- Shingrix  Completed  ? HPV VACCINES  Aged Out  ? ?-Negative for high alcohol intake. ? ?-Concerns and/or follow up today:  ?Alzheimer's disease: Since his last visit he has seen his neurologist. Problem seems to be getting worse. ?He is on Aticept 10 mg daily. ?Anxiety: He is on Sertraline 50 mg daily, tolerating medication well. Problem is stable. ? ?HLD: He is on Simvastatin 20 mg daily and Zetia 10 mg daily. ?He is on Aspirin 325 mg. ?No hx of CVA,CAD,or atrial fib. ? ?Component ?    Latest Ref Rng 09/01/2020  ?Cholesterol ?    0 - 200 mg/dL 175   ?Triglycerides ?     0.0 - 149.0 mg/dL 195.0 (H)   ?HDL Cholesterol ?    >39.00 mg/dL 47.00   ?VLDL ?    0.0 - 40.0 mg/dL 39.0   ?LDL (calc) ?    0 - 99 mg/dL 89   ?Total CHOL/HDL Ratio 4   ?NonHDL 127.99   ?  ?Review of Systems  ?Constitutional:  Negative for activity change, appetite change and fever.  ?HENT:  Negative for nosebleeds, sore throat, trouble swallowing and voice change.   ?Eyes:  Negative for redness and visual disturbance.  ?Respiratory:  Negative for cough, shortness of breath and wheezing.   ?Cardiovascular:  Negative for chest pain, palpitations and leg swelling.  ?Gastrointestinal:  Negative for abdominal pain, blood in stool, nausea and vomiting.  ?Endocrine: Negative for cold intolerance, heat intolerance, polydipsia, polyphagia and polyuria.  ?Genitourinary:  Negative for decreased urine volume, dysuria, genital sores, hematuria and testicular pain.  ?Musculoskeletal:  Negative for gait problem and myalgias.  ?Skin:  Negative for color change and rash.  ?Allergic/Immunologic: Negative for environmental allergies.  ?Neurological:  Negative for syncope, weakness and headaches.  ?Hematological:  Negative for adenopathy. Does not bruise/bleed easily.  ?Psychiatric/Behavioral:  Positive for confusion. Negative for hallucinations. The patient is nervous/anxious.   ?All other systems reviewed and are negative. ? ?Current Outpatient Medications on File Prior to Visit  ?Medication Sig Dispense Refill  ? aspirin 325 MG tablet Take 325 mg by mouth daily.    ? Coenzyme Q10 (COQ10) 100 MG CAPS Take 1 capsule by mouth daily.    ? donepezil (ARICEPT) 10 MG tablet Take 1 tablet daily 90  tablet 3  ? ezetimibe (ZETIA) 10 MG tablet TAKE ONE TABLET BY MOUTH ONE TIME DAILY 90 tablet 3  ? Flax OIL Take 1 tablet by mouth daily.    ? Garlic 1000 MG CAPS Take 1 capsule by mouth daily.    ? Levocetirizine Dihydrochloride (XYZAL PO) Take by mouth.    ? Omega 3 1200 MG CAPS Take 1 capsule by mouth daily.    ? sertraline (ZOLOFT) 50 MG  tablet Take 1 tablet by mouth once daily 90 tablet 0  ? simvastatin (ZOCOR) 20 MG tablet Take 1 tablet (20 mg total) by mouth daily. 90 tablet 1  ? vitamin C (ASCORBIC ACID) 500 MG tablet Take 500 mg by mouth daily.    ? ?No current facility-administered medications on file prior to visit.  ? ?Past Medical History:  ?Diagnosis Date  ? Allergy   ? Dementia due to Alzheimer's disease   ? Generalized anxiety disorder 03/31/2016  ? History of head injury   ? Hyperlipemia, mixed 03/31/2016  ? Major depressive disorder   ? ?History reviewed. No pertinent surgical history. ? ?No Known Allergies ? ?Family History  ?Problem Relation Age of Onset  ? Mental illness Mother   ? Memory loss Mother   ? COPD Father   ? Memory loss Brother   ?     Older half-brother  ? ?Social History  ? ?Socioeconomic History  ? Marital status: Married  ?  Spouse name: Not on file  ? Number of children: Not on file  ? Years of education: 11  ? Highest education level: Some college, no degree  ?Occupational History  ? Occupation: retired  ?Tobacco Use  ? Smoking status: Former  ?  Passive exposure: Past  ? Smokeless tobacco: Former  ?  Types: Chew  ?Vaping Use  ? Vaping Use: Never used  ?Substance and Sexual Activity  ? Alcohol use: Yes  ?  Alcohol/week: 7.0 standard drinks  ?  Types: 7 Glasses of wine per week  ?  Comment: 1 glass every evening  ? Drug use: Never  ? Sexual activity: Yes  ?Other Topics Concern  ? Not on file  ?Social History Narrative  ? Right handed  ? One story home  ? Lives with wife  ? ?Social Determinants of Health  ? ?Financial Resource Strain: Low Risk   ? Difficulty of Paying Living Expenses: Not hard at all  ?Food Insecurity: No Food Insecurity  ? Worried About Programme researcher, broadcasting/film/video in the Last Year: Never true  ? Ran Out of Food in the Last Year: Never true  ?Transportation Needs: No Transportation Needs  ? Lack of Transportation (Medical): No  ? Lack of Transportation (Non-Medical): No  ?Physical Activity: Sufficiently  Active  ? Days of Exercise per Week: 5 days  ? Minutes of Exercise per Session: 30 min  ?Stress: No Stress Concern Present  ? Feeling of Stress : Not at all  ?Social Connections: Not on file  ? ?Vitals:  ? 09/04/21 0851  ?BP: 128/70  ?Pulse: 98  ?Resp: 16  ?SpO2: 97%  ? ?Body mass index is 26.51 kg/m?. ? ?Wt Readings from Last 3 Encounters:  ?09/04/21 164 lb 4 oz (74.5 kg)  ?07/23/21 166 lb (75.3 kg)  ?06/30/21 158 lb (71.7 kg)  ? ?Physical Exam ?Vitals and nursing note reviewed.  ?Constitutional:   ?   Appearance: Normal appearance. He is normal weight.  ?HENT:  ?   Head: Normocephalic and atraumatic.  ?  Right Ear: External ear normal. Tympanic membrane is not erythematous.  ?   Left Ear: External ear normal. Tympanic membrane is not erythematous.  ?   Ears:  ?   Comments: Cerumen excess bilateral, L>R, can see TM partially. ?   Nose: Nose normal.  ?   Mouth/Throat:  ?   Mouth: Mucous membranes are moist.  ?   Pharynx: Oropharynx is clear.  ?Eyes:  ?   Extraocular Movements: Extraocular movements intact.  ?   Conjunctiva/sclera: Conjunctivae normal.  ?   Pupils: Pupils are equal, round, and reactive to light.  ?Cardiovascular:  ?   Rate and Rhythm: Normal rate and regular rhythm.  ?   Pulses: Normal pulses.  ?   Heart sounds: Normal heart sounds.  ?Pulmonary:  ?   Effort: Pulmonary effort is normal.  ?   Breath sounds: Normal breath sounds.  ?Abdominal:  ?   General: Abdomen is flat. Bowel sounds are normal.  ?   Palpations: Abdomen is soft.  ?Musculoskeletal:     ?   General: Normal range of motion.  ?   Cervical back: Normal range of motion.  ?Skin: ?   General: Skin is warm.  ?Neurological:  ?   Mental Status: He is alert. Mental status is at baseline.  ?   Comments: Oriented in place and person.  ? ?ASSESSMENT AND PLAN: ? ?RandyRandy Bean was seen today for annual exam. ? ?Diagnoses and all orders for this visit: ?Orders Placed This Encounter  ?Procedures  ? Comprehensive metabolic panel  ? Lipid panel  ? LDL  cholesterol, direct  ? ?Lab Results  ?Component Value Date  ? CREATININE 0.77 09/04/2021  ? BUN 14 09/04/2021  ? NA 137 09/04/2021  ? K 4.0 09/04/2021  ? CL 101 09/04/2021  ? CO2 29 09/04/2021  ? ?Lab Results  ?Com

## 2021-09-04 ENCOUNTER — Ambulatory Visit (INDEPENDENT_AMBULATORY_CARE_PROVIDER_SITE_OTHER): Payer: Medicare HMO | Admitting: Family Medicine

## 2021-09-04 ENCOUNTER — Encounter: Payer: Self-pay | Admitting: Family Medicine

## 2021-09-04 VITALS — BP 128/70 | HR 98 | Resp 16 | Ht 66.0 in | Wt 164.2 lb

## 2021-09-04 DIAGNOSIS — Z0000 Encounter for general adult medical examination without abnormal findings: Secondary | ICD-10-CM

## 2021-09-04 DIAGNOSIS — E785 Hyperlipidemia, unspecified: Secondary | ICD-10-CM

## 2021-09-04 DIAGNOSIS — E782 Mixed hyperlipidemia: Secondary | ICD-10-CM

## 2021-09-04 DIAGNOSIS — F411 Generalized anxiety disorder: Secondary | ICD-10-CM

## 2021-09-04 DIAGNOSIS — E781 Pure hyperglyceridemia: Secondary | ICD-10-CM

## 2021-09-04 DIAGNOSIS — R69 Illness, unspecified: Secondary | ICD-10-CM | POA: Diagnosis not present

## 2021-09-04 LAB — COMPREHENSIVE METABOLIC PANEL
ALT: 36 U/L (ref 0–53)
AST: 25 U/L (ref 0–37)
Albumin: 4.6 g/dL (ref 3.5–5.2)
Alkaline Phosphatase: 54 U/L (ref 39–117)
BUN: 14 mg/dL (ref 6–23)
CO2: 29 mEq/L (ref 19–32)
Calcium: 9.8 mg/dL (ref 8.4–10.5)
Chloride: 101 mEq/L (ref 96–112)
Creatinine, Ser: 0.77 mg/dL (ref 0.40–1.50)
GFR: 88.99 mL/min (ref >60.00)
Glucose, Bld: 99 mg/dL (ref 70–99)
Potassium: 4 mEq/L (ref 3.5–5.1)
Sodium: 137 mEq/L (ref 135–145)
Total Bilirubin: 0.6 mg/dL (ref 0.2–1.2)
Total Protein: 7.1 g/dL (ref 6.0–8.3)

## 2021-09-04 LAB — LIPID PANEL
Cholesterol: 168 mg/dL (ref 0–200)
HDL: 43 mg/dL (ref >39.00)
NonHDL: 124.77
Total CHOL/HDL Ratio: 4
Triglycerides: 246 mg/dL — ABNORMAL HIGH (ref 0.0–149.0)
VLDL: 49.2 mg/dL — ABNORMAL HIGH (ref 0.0–40.0)

## 2021-09-04 LAB — LDL CHOLESTEROL, DIRECT: Direct LDL: 91 mg/dL

## 2021-09-04 NOTE — Assessment & Plan Note (Addendum)
Continue Simvastatin 20 mg daily,zetia 10 mg daily, and low fat diet. ?Further recommendations according to FLP results. ?

## 2021-09-04 NOTE — Patient Instructions (Signed)
A few things to remember from today's visit: ? ?Routine general medical examination at a health care facility ? ?Hyperlipidemia, unspecified hyperlipidemia type ? ?If you need refills please call your pharmacy. ?Do not use My Chart to request refills or for acute issues that need immediate attention. ?  ? ?Please be sure medication list is accurate. ?If a new problem present, please set up appointment sooner than planned today. ? ?Preventive Care 48 Years and Older, Male ?Preventive care refers to lifestyle choices and visits with your health care provider that can promote health and wellness. Preventive care visits are also called wellness exams. ?What can I expect for my preventive care visit? ?Counseling ?During your preventive care visit, your health care provider may ask about your: ?Medical history, including: ?Past medical problems. ?Family medical history. ?History of falls. ?Current health, including: ?Emotional well-being. ?Home life and relationship well-being. ?Sexual activity. ?Memory and ability to understand (cognition). ?Lifestyle, including: ?Alcohol, nicotine or tobacco, and drug use. ?Access to firearms. ?Diet, exercise, and sleep habits. ?Work and work Statistician. ?Sunscreen use. ?Safety issues such as seatbelt and bike helmet use. ?Physical exam ?Your health care provider will check your: ?Height and weight. These may be used to calculate your BMI (body mass index). BMI is a measurement that tells if you are at a healthy weight. ?Waist circumference. This measures the distance around your waistline. This measurement also tells if you are at a healthy weight and may help predict your risk of certain diseases, such as type 2 diabetes and high blood pressure. ?Heart rate and blood pressure. ?Body temperature. ?Skin for abnormal spots. ?What immunizations do I need? ? ?Vaccines are usually given at various ages, according to a schedule. Your health care provider will recommend vaccines for you  based on your age, medical history, and lifestyle or other factors, such as travel or where you work. ?What tests do I need? ?Screening ?Your health care provider may recommend screening tests for certain conditions. This may include: ?Lipid and cholesterol levels. ?Diabetes screening. This is done by checking your blood sugar (glucose) after you have not eaten for a while (fasting). ?Hepatitis C test. ?Hepatitis B test. ?HIV (human immunodeficiency virus) test. ?STI (sexually transmitted infection) testing, if you are at risk. ?Lung cancer screening. ?Colorectal cancer screening. ?Prostate cancer screening. ?Abdominal aortic aneurysm (AAA) screening. You may need this if you are a current or former smoker. ?Talk with your health care provider about your test results, treatment options, and if necessary, the need for more tests. ?Follow these instructions at home: ?Eating and drinking ? ?Eat a diet that includes fresh fruits and vegetables, whole grains, lean protein, and low-fat dairy products. Limit your intake of foods with high amounts of sugar, saturated fats, and salt. ?Take vitamin and mineral supplements as recommended by your health care provider. ?Do not drink alcohol if your health care provider tells you not to drink. ?If you drink alcohol: ?Limit how much you have to 0-2 drinks a day. ?Know how much alcohol is in your drink. In the [place] one drink equals one 12 oz bottle of beer (355 mL), one 5 oz glass of wine (148 mL), or one 1? oz glass of hard liquor (44 mL). ?Lifestyle ?Brush your teeth every morning and night with fluoride toothpaste. Floss one time each day. ?Exercise for at least 30 minutes 5 or more days each week. ?Do not use any products that contain nicotine or tobacco. These products include cigarettes, chewing tobacco, and vaping devices, such  as e-cigarettes. If you need help quitting, ask your health care provider. ?Do not use drugs. ?If you are sexually active, practice safe sex. Use a  condom or other form of protection to prevent STIs. ?Take aspirin only as told by your health care provider. Make sure that you understand how much to take and what form to take. Work with your health care provider to find out whether it is safe and beneficial for you to take aspirin daily. ?Ask your health care provider if you need to take a cholesterol-lowering medicine (statin). ?Find healthy ways to manage stress, such as: ?Meditation, yoga, or listening to music. ?Journaling. ?Talking to a trusted person. ?Spending time with friends and family. ?Safety ?Always wear your seat belt while driving or riding in a vehicle. ?Do not drive: ?If you have been drinking alcohol. Do not ride with someone who has been drinking. ?When you are tired or distracted. ?While texting. ?If you have been using any mind-altering substances or drugs. ?Wear a helmet and other protective equipment during sports activities. ?If you have firearms in your house, make sure you follow all gun safety procedures. ?Minimize exposure to UV radiation to reduce your risk of skin cancer. ?What's next? ?Visit your health care provider once a year for an annual wellness visit. ?Ask your health care provider how often you should have your eyes and teeth checked. ?Stay up to date on all vaccines. ?This information is not intended to replace advice given to you by your health care provider. Make sure you discuss any questions you have with your health care provider. ?Document Revised: 11/05/2020 Document Reviewed: 11/05/2020 ?Elsevier Patient Education ? [place]. ? ? ? ?

## 2021-09-04 NOTE — Assessment & Plan Note (Signed)
Stable. ?Continue Sertraline 50 mg daily. ?As far as symptoms are stable we can continue following annually. ?

## 2021-11-07 ENCOUNTER — Other Ambulatory Visit: Payer: Self-pay | Admitting: Family Medicine

## 2021-11-07 DIAGNOSIS — E785 Hyperlipidemia, unspecified: Secondary | ICD-10-CM

## 2021-12-07 ENCOUNTER — Other Ambulatory Visit: Payer: Self-pay | Admitting: Family Medicine

## 2021-12-07 DIAGNOSIS — F411 Generalized anxiety disorder: Secondary | ICD-10-CM

## 2022-01-13 NOTE — Progress Notes (Signed)
ACUTE VISIT Chief Complaint  Patient presents with   Cough    After exertion. Taking naps in the middle of the day, tired after doing usual walk.    HPI: [name] is a 71 y.o. male with hx of Alzheimer's disease, anxiety, and HLD here today with his wife, who is concerned about chest discomfort,cough,and fatigue. He does not feel like he is having any problem.  Cough This is a new problem. The current episode started more than 1 month ago. The problem has been gradually worsening. The problem occurs every few hours. The cough is Non-productive. Associated symptoms include chest pain. Pertinent negatives include no chills, ear congestion, ear pain, fever, headaches, heartburn, hemoptysis, myalgias, nasal congestion, rash, rhinorrhea, sore throat, sweats, weight loss or wheezing. The symptoms are aggravated by exercise. He has tried nothing for the symptoms. His past medical history is significant for environmental allergies. There is no history of COPD.  Cough has been going on for a couple of months. No hx of sick contact or URI.  Former smoker, quit 48 years ago.  Symptoms are not limiting his activities, so he is not concerned.  His wife has noted chest discomfort at least 3 times, he has not complained but he does "hold" his chest sometimes after exertion and at rest. Not sure about type of pain , he has mentioned that it feels like a "knot", one time it lasted 30 min. It does not seem to be radiated. His wife wonders if this is caused by anxiety/stress, he has had chest discomfort before during acute anxiety. Negative for palpitations,orthopnea,PND,or edema.  He has been more fatigue than usual, "exhausted" after walking for 15-20 min and taking naps. His wife does not think he has SOB. Problem is not all the time, it seems to be more frequent.  Review of Systems  Constitutional:  Positive for activity change. Negative for appetite change, chills, diaphoresis, fever and  weight loss.  HENT:  Negative for congestion, ear pain, mouth sores, nosebleeds, rhinorrhea and sore throat.   Respiratory:  Positive for cough. Negative for hemoptysis and wheezing.   Cardiovascular:  Positive for chest pain.  Gastrointestinal:  Negative for abdominal pain, heartburn, nausea and vomiting.  Endocrine: Negative for cold intolerance and heat intolerance.  Genitourinary:  Negative for decreased urine volume, dysuria and hematuria.  Musculoskeletal:  Negative for myalgias.  Skin:  Negative for rash.  Allergic/Immunologic: Positive for environmental allergies.  Neurological:  Negative for syncope, weakness and headaches.  Psychiatric/Behavioral:  Negative for hallucinations.   Rest see pertinent positives and negatives per HPI.  Current Outpatient Medications on File Prior to Visit  Medication Sig Dispense Refill   aspirin 325 MG tablet Take 325 mg by mouth daily.     Coenzyme Q10 (COQ10) 100 MG CAPS Take 1 capsule by mouth daily.     donepezil (ARICEPT) 10 MG tablet Take 1 tablet daily 90 tablet 3   ezetimibe (ZETIA) 10 MG tablet TAKE ONE TABLET BY MOUTH ONE TIME DAILY 90 tablet 3   Flax OIL Take 1 tablet by mouth daily.     Garlic 1000 MG CAPS Take 1 capsule by mouth daily.     Levocetirizine Dihydrochloride (XYZAL PO) Take by mouth.     Omega 3 1200 MG CAPS Take 1 capsule by mouth daily.     sertraline (ZOLOFT) 50 MG tablet Take 1 tablet by mouth once daily 90 tablet 3   simvastatin (ZOCOR) 20 MG tablet Take 1 tablet (  20 mg total) by mouth daily. 90 tablet 1   vitamin C (ASCORBIC ACID) 500 MG tablet Take 500 mg by mouth daily.     No current facility-administered medications on file prior to visit.   Past Medical History:  Diagnosis Date   Allergy    Dementia due to Alzheimer's disease    Generalized anxiety disorder 03/31/2016   History of head injury    Hyperlipemia, mixed 03/31/2016   Major depressive disorder    No Known Allergies  Social History    Socioeconomic History   Marital status: Married    Spouse name: Not on file   Number of children: Not on file   Years of education: 13   Highest education level: Some college, no degree  Occupational History   Occupation: retired  Tobacco Use   Smoking status: Former    Passive exposure: Past   Smokeless tobacco: Former    Types: Associate Professor Use: Never used  Substance and Sexual Activity   Alcohol use: Yes    Alcohol/week: 7.0 standard drinks of alcohol    Types: 7 Glasses of wine per week    Comment: 1 glass every evening   Drug use: Never   Sexual activity: Yes  Other Topics Concern   Not on file  Social History Narrative   Right handed   One story home   Lives with wife   Social Determinants of Health   Financial Resource Strain: Low Risk  (06/30/2021)   Overall Financial Resource Strain (CARDIA)    Difficulty of Paying Living Expenses: Not hard at all  Food Insecurity: No Food Insecurity (06/30/2021)   Hunger Vital Sign    Worried About Running Out of Food in the Last Year: Never true    Ran Out of Food in the Last Year: Never true  Transportation Needs: No Transportation Needs (06/30/2021)   PRAPARE - Administrator, Civil Service (Medical): No    Lack of Transportation (Non-Medical): No  Physical Activity: Sufficiently Active (06/30/2021)   Exercise Vital Sign    Days of Exercise per Week: 5 days    Minutes of Exercise per Session: 30 min  Stress: No Stress Concern Present (06/30/2021)   [employer] of Occupational Health - Occupational Stress Questionnaire    Feeling of Stress : Not at all  Social Connections: Socially Integrated (07/24/2020)   Social Connection and Isolation Panel [NHANES]    Frequency of Communication with Friends and Family: More than three times a week    Frequency of Social Gatherings with Friends and Family: More than three times a week    Attends Religious Services: More than 4 times per year    Active Member  of [employer] or Organizations: Yes    Attends Banker Meetings: More than 4 times per year    Marital Status: Married   Vitals:   01/15/22 1051  BP: 136/70  Pulse: 71  Resp: 16  Temp: 98.5 F (36.9 C)  SpO2: 98%   Body mass index is 26.07 kg/m.  Physical Exam Vitals and nursing note reviewed.  Constitutional:      General: He is not in acute distress.    Appearance: He is well-developed.  HENT:     Head: Normocephalic and atraumatic.     Mouth/Throat:     Mouth: Mucous membranes are moist.     Pharynx: Oropharynx is clear.  Eyes:     Conjunctiva/sclera: Conjunctivae normal.  Cardiovascular:  Rate and Rhythm: Normal rate and regular rhythm.     Heart sounds: No murmur heard.    Comments: DP pulses palpable. Pulmonary:     Effort: Pulmonary effort is normal. No respiratory distress.     Breath sounds: Normal breath sounds.  Chest:     Chest wall: No tenderness.  Abdominal:     Palpations: Abdomen is soft. There is no hepatomegaly or mass.     Tenderness: There is no abdominal tenderness.  Musculoskeletal:     Right lower leg: No edema.     Left lower leg: No edema.  Lymphadenopathy:     Cervical: No cervical adenopathy.  Skin:    General: Skin is warm.     Findings: No erythema or rash.  Neurological:     Mental Status: He is alert. Mental status is at baseline.     Cranial Nerves: No cranial nerve deficit.     Gait: Gait normal.  Psychiatric:     Comments: Well groomed, good eye contact.   ASSESSMENT AND PLAN:  [name] was seen today for cough.  Diagnoses and all orders for this visit: Orders Placed This Encounter  Procedures   DG Chest 2 View   CBC   TSH   Basic metabolic panel   Ambulatory referral to Cardiology   EKG 12-Lead   Chest discomfort We discussed possible etiologies. EKG today:SR, normal axis and intervals, LAE. No significant changes when compared with EKG 03/2016. Appt with cardiologist will be arranged. Instructed  about warning signs.  Fatigue, unspecified type ?Deconditioning. His wife does not think he is short of breath. Will obtain blood work. Monitor for new symptoms.  Cough, unspecified type Lung auscultation negative. Allergies, COPD among some to consider. CXR will be obtained today.Problem is not bother him, so I do not think medication is needed at this time. Instructed about warning signs.  Return if symptoms worsen or fail to improve.  Yohana Bartha G. [place], MD  [facility]. [place] office.

## 2022-01-15 ENCOUNTER — Ambulatory Visit (INDEPENDENT_AMBULATORY_CARE_PROVIDER_SITE_OTHER): Payer: Medicare HMO | Admitting: Family Medicine

## 2022-01-15 ENCOUNTER — Encounter: Payer: Self-pay | Admitting: Family Medicine

## 2022-01-15 ENCOUNTER — Ambulatory Visit (INDEPENDENT_AMBULATORY_CARE_PROVIDER_SITE_OTHER): Payer: Medicare HMO

## 2022-01-15 VITALS — BP 136/70 | HR 71 | Temp 98.5°F | Resp 16 | Ht 66.0 in | Wt 161.5 lb

## 2022-01-15 DIAGNOSIS — R059 Cough, unspecified: Secondary | ICD-10-CM

## 2022-01-15 DIAGNOSIS — R0789 Other chest pain: Secondary | ICD-10-CM | POA: Diagnosis not present

## 2022-01-15 DIAGNOSIS — R5383 Other fatigue: Secondary | ICD-10-CM

## 2022-01-15 DIAGNOSIS — R0600 Dyspnea, unspecified: Secondary | ICD-10-CM | POA: Diagnosis not present

## 2022-01-15 NOTE — Patient Instructions (Signed)
A few things to remember from today's visit:  Chest discomfort - Plan: EKG 12-Lead, Ambulatory referral to Cardiology  Cough, unspecified type - Plan: DG Chest 2 View  If you need refills please call your pharmacy. Do not use My Chart to request refills or for acute issues that need immediate attention.   Appt with cardiologist will be arranged. Avoid exercise for now.  Please be sure medication list is accurate. If a new problem present, please set up appointment sooner than planned today.  Nonspecific Chest Pain Chest pain can be caused by many different conditions. Some causes of chest pain can be life-threatening. These will require treatment right away. Serious causes of chest pain include: Heart attack. A tear in the body's main blood vessel. Redness and swelling (inflammation) around your heart. Blood clot in your lungs. Other causes of chest pain may not be so serious. These include: Heartburn. Anxiety or stress. Damage to bones or muscles in your chest. Lung infections. Chest pain can feel like: Pain or discomfort in your chest. Crushing, pressure, aching, or squeezing pain. Burning or tingling. Dull or sharp pain that is worse when you move, cough, or take a deep breath. Pain or discomfort that is also felt in your back, neck, jaw, shoulder, or arm, or pain that spreads to any of these areas. It is hard to know whether your pain is caused by something that is serious or something that is not so serious. So it is important to see your doctor right away if you have chest pain. Follow these instructions at home: Medicines Take over-the-counter and prescription medicines only as told by your doctor. If you were prescribed an antibiotic medicine, take it as told by your doctor. Do not stop taking the antibiotic even if you start to feel better. Lifestyle  Rest as told by your doctor. Do not use any products that contain nicotine or tobacco, such as cigarettes,  e-cigarettes, and chewing tobacco. If you need help quitting, ask your doctor. Do not drink alcohol. Make lifestyle changes as told by your doctor. These may include: Getting regular exercise. Ask your doctor what activities are safe for you. Eating a heart-healthy diet. A diet and nutrition specialist (dietitian) can help you to learn healthy eating options. Staying at a healthy weight. Treating diabetes or high blood pressure, if needed. Lowering your stress. Activities such as yoga and relaxation techniques can help. General instructions Pay attention to any changes in your symptoms. Tell your doctor about them or any new symptoms. Avoid any activities that cause chest pain. Keep all follow-up visits as told by your doctor. This is important. You may need more testing if your chest pain does not go away. Contact a doctor if: Your chest pain does not go away. You feel depressed. You have a fever. Get help right away if: Your chest pain is worse. You have a cough that gets worse, or you cough up blood. You have very bad (severe) pain in your belly (abdomen). You pass out (faint). You have either of these for no clear reason: Sudden chest discomfort. Sudden discomfort in your arms, back, neck, or jaw. You have shortness of breath at any time. You suddenly start to sweat, or your skin gets clammy. You feel sick to your stomach (nauseous). You throw up (vomit). You suddenly feel lightheaded or dizzy. You feel very weak or tired. Your heart starts to beat fast, or it feels like it is skipping beats. These symptoms may be an emergency.  Do not wait to see if the symptoms will go away. Get medical help right away. Call your local emergency services (911 in the [place] Do not drive yourself to the hospital. Summary Chest pain can be caused by many different conditions. The cause may be serious and need treatment right away. If you have chest pain, see your doctor right away. Follow your  doctor's instructions for taking medicines and making lifestyle changes. Keep all follow-up visits as told by your doctor. This includes visits for any further testing if your chest pain does not go away. Be sure to know the signs that show that your condition has become worse. Get help right away if you have these symptoms. This information is not intended to replace advice given to you by your health care provider. Make sure you discuss any questions you have with your health care provider. Document Revised: 07/24/2020 Document Reviewed: 07/24/2020 Elsevier Patient Education  2023 [employer].

## 2022-01-18 ENCOUNTER — Telehealth: Payer: Self-pay

## 2022-01-18 NOTE — Telephone Encounter (Signed)
-----   Message from Betty G [place], MD sent at 01/15/2022 10:45 PM EDT ----- Regarding: labs He had labs done in 08/2021, so I did not think we needed labs this time but noted we have not checked TSH and cbc in sometime. I placed lab orders. Thanks, BJ

## 2022-01-26 ENCOUNTER — Other Ambulatory Visit (INDEPENDENT_AMBULATORY_CARE_PROVIDER_SITE_OTHER): Payer: Medicare HMO

## 2022-01-26 DIAGNOSIS — R5383 Other fatigue: Secondary | ICD-10-CM

## 2022-01-26 LAB — BASIC METABOLIC PANEL
BUN: 16 mg/dL (ref 6–23)
CO2: 30 mEq/L (ref 19–32)
Calcium: 10.2 mg/dL (ref 8.4–10.5)
Chloride: 101 mEq/L (ref 96–112)
Creatinine, Ser: 0.96 mg/dL (ref 0.40–1.50)
GFR: 78.35 mL/min (ref >60.00)
Glucose, Bld: 98 mg/dL (ref 70–99)
Potassium: 4.3 mEq/L (ref 3.5–5.1)
Sodium: 140 mEq/L (ref 135–145)

## 2022-01-26 LAB — CBC
HCT: 44.5 % (ref 39.0–52.0)
Hemoglobin: 15.3 g/dL (ref 13.0–17.0)
MCHC: 34.5 g/dL (ref 30.0–36.0)
MCV: 92.9 fl (ref 78.0–100.0)
Platelets: 203 10*3/uL (ref 150.0–400.0)
RBC: 4.79 Mil/uL (ref 4.22–5.81)
RDW: 12.4 % (ref 11.5–15.5)
WBC: 6.9 10*3/uL (ref 4.0–10.5)

## 2022-01-26 LAB — TSH: TSH: 0.75 u[IU]/mL (ref 0.35–5.50)

## 2022-02-16 ENCOUNTER — Ambulatory Visit (HOSPITAL_COMMUNITY): Payer: Medicare HMO | Attending: Internal Medicine

## 2022-02-16 ENCOUNTER — Ambulatory Visit: Payer: Medicare HMO | Attending: Internal Medicine | Admitting: Internal Medicine

## 2022-02-16 ENCOUNTER — Encounter: Payer: Self-pay | Admitting: Internal Medicine

## 2022-02-16 VITALS — BP 132/75 | HR 62 | Ht 65.0 in | Wt 162.4 lb

## 2022-02-16 DIAGNOSIS — R079 Chest pain, unspecified: Secondary | ICD-10-CM | POA: Diagnosis not present

## 2022-02-16 LAB — ECHOCARDIOGRAM COMPLETE
Area-P 1/2: 2.37 cm2
Height: 65 in
S' Lateral: 2 cm
Weight: 2598.4 oz

## 2022-02-16 NOTE — Patient Instructions (Signed)
Medication Instructions:  Your physician recommends that you continue on your current medications as directed. Please refer to the Current Medication list given to you today.  *If you need a refill on your cardiac medications before your next appointment, please call your pharmacy*  Testing/Procedures: Your physician has requested that you have an echocardiogram. Echocardiography is a painless test that uses sound waves to create images of your heart. It provides your doctor with information about the size and shape of your heart and how well your heart's chambers and valves are working. This procedure takes approximately one hour. There are no restrictions for this procedure.  Your physician has requested that you have a lexiscan myoview. For further information please visit [email or web address]. Please follow instruction sheet, as given. This will take place at [address]  How to prepare for your Myocardial Perfusion Test: Do not eat or drink 3 hours prior to your test, except you may have water. Do not consume products containing caffeine (regular or decaffeinated) 12 hours prior to your test. (ex: coffee, chocolate, sodas, tea). Do bring a list of your current medications with you.  If not listed below, you may take your medications as normal. Do wear comfortable clothes (no dresses or overalls) and walking shoes, tennis shoes preferred (No heels or open toe shoes are allowed). Do NOT wear cologne, perfume, aftershave, or lotions (deodorant is allowed). The test will take approximately 3 to 4 hours to complete If these instructions are not followed, your test will have to be rescheduled.  Follow-Up: At [facility], you and your health needs are our priority.  As part of our continuing mission to provide you with exceptional heart care, we have created designated Provider Care Teams.  These Care Teams include your primary Cardiologist (physician) and Advanced Practice  Providers (APPs -  Physician Assistants and Nurse Practitioners) who all work together to provide you with the care you need, when you need it.  We recommend signing up for the patient portal called "MyChart".  Sign up information is provided on this After Visit Summary.  MyChart is used to connect with patients for Virtual Visits (Telemedicine).  Patients are able to view lab/test results, encounter notes, upcoming appointments, etc.  Non-urgent messages can be sent to your provider as well.   To learn more about what you can do with MyChart, go to [email or web address].    Your next appointment:   AS NEEDED with Dr. Harl Bowie

## 2022-02-16 NOTE — Progress Notes (Signed)
Cardiology Office Note:    Date:  02/16/2022   ID:  Randy Bean, DOB 04-30-1951, MRN [id number]  PCP:  [place], Betty G, MD   [facility] Providers Cardiologist:  None     Referring MD: [place], Betty G, MD   No chief complaint on file. CP  History of Present Illness:    Randy Bean is a 71 y.o. male with a hx of GAD, HLD, Alzheimer's dx, referral from Dr. [place] his PCP for CP in August. EKG showed NSR with poor R wave progression. His wife is concerned gave most of the hx. He has dementia and HPI is challenging. She notes he reports CP and is exhausted after walks. He has coughing with it. This has been persistent. Resting improves it. He has no prior cardiac disease. No prior cardiac w/u. Former smoker, quit 1975. He denies syncope or palpitations. He denies PND/orthopnea/LE edema.   Past Medical History:  Diagnosis Date   Allergy    Dementia due to Alzheimer's disease    Generalized anxiety disorder 03/31/2016   History of head injury    Hyperlipemia, mixed 03/31/2016   Major depressive disorder     No past surgical history on file.  Current Medications: Current Meds  Medication Sig   Coenzyme Q10 (COQ10) 100 MG CAPS Take 1 capsule by mouth daily.   donepezil (ARICEPT) 10 MG tablet Take 1 tablet daily   ezetimibe (ZETIA) 10 MG tablet TAKE ONE TABLET BY MOUTH ONE TIME DAILY   Flax OIL Take 1 tablet by mouth daily.   Garlic 1000 MG CAPS Take 1 capsule by mouth daily.   Omega 3 1200 MG CAPS Take 1 capsule by mouth daily.   sertraline (ZOLOFT) 50 MG tablet Take 1 tablet by mouth once daily   simvastatin (ZOCOR) 20 MG tablet Take 1 tablet (20 mg total) by mouth daily.   vitamin C (ASCORBIC ACID) 500 MG tablet Take 500 mg by mouth daily.     Allergies:   Patient has no known allergies.   Social History   Socioeconomic History   Marital status: Married    Spouse name: Not on file   Number of children: Not on file   Years of education: 13   Highest  education level: Some college, no degree  Occupational History   Occupation: retired  Tobacco Use   Smoking status: Former    Passive exposure: Past   Smokeless tobacco: Former    Types: Associate Professor Use: Never used  Substance and Sexual Activity   Alcohol use: Yes    Alcohol/week: 7.0 standard drinks of alcohol    Types: 7 Glasses of wine per week    Comment: 1 glass every evening   Drug use: Never   Sexual activity: Yes  Other Topics Concern   Not on file  Social History Narrative   Right handed   One story home   Lives with wife   Social Determinants of Health   Financial Resource Strain: Low Risk  (06/30/2021)   Overall Financial Resource Strain (CARDIA)    Difficulty of Paying Living Expenses: Not hard at all  Food Insecurity: No Food Insecurity (06/30/2021)   Hunger Vital Sign    Worried About Running Out of Food in the Last Year: Never true    Ran Out of Food in the Last Year: Never true  Transportation Needs: No Transportation Needs (06/30/2021)   PRAPARE - Administrator, Civil Service (Medical):  No    Lack of Transportation (Non-Medical): No  Physical Activity: Sufficiently Active (06/30/2021)   Exercise Vital Sign    Days of Exercise per Week: 5 days    Minutes of Exercise per Session: 30 min  Stress: No Stress Concern Present (06/30/2021)   [employer] of Occupational Health - Occupational Stress Questionnaire    Feeling of Stress : Not at all  Social Connections: Socially Integrated (07/24/2020)   Social Connection and Isolation Panel [NHANES]    Frequency of Communication with Friends and Family: More than three times a week    Frequency of Social Gatherings with Friends and Family: More than three times a week    Attends Religious Services: More than 4 times per year    Active Member of [employer] or Organizations: Yes    Attends Engineer, structural: More than 4 times per year    Marital Status: Married     Family  History: The patient's family history includes COPD in his father; Memory loss in his brother and mother; Mental illness in his mother. NO family hx of CAD  ROS:   Please see the history of present illness.     All other systems reviewed and are negative.  EKGs/Labs/Other Studies Reviewed:    The following studies were reviewed today:   EKG:  EKG is  ordered today.  The ekg ordered today demonstrates   02/16/2022- NSR,  poor r wave progression (seen in August)  Recent Labs: 09/04/2021: ALT 36 01/26/2022: BUN 16; Creatinine, Ser 0.96; Hemoglobin 15.3; Platelets 203.0; Potassium 4.3; Sodium 140; TSH 0.75  Recent Lipid Panel    Component Value Date/Time   CHOL 168 09/04/2021 0934   TRIG 246.0 (H) 09/04/2021 0934   HDL 43.00 09/04/2021 0934   CHOLHDL 4 09/04/2021 0934   VLDL 49.2 (H) 09/04/2021 0934   LDLCALC 89 09/01/2020 1112   LDLDIRECT 91.0 09/04/2021 0934     Risk Assessment/Calculations:     Physical Exam:    VS:    Vitals:   02/16/22 0939  BP: 132/75  Pulse: 62  SpO2: 97%     Wt Readings from Last 3 Encounters:  01/15/22 161 lb 8 oz (73.3 kg)  09/04/21 164 lb 4 oz (74.5 kg)  07/23/21 166 lb (75.3 kg)     GEN:  Well nourished, well developed in no acute distress HEENT: Normal NECK: No JVD; No carotid bruits LYMPHATICS: No lymphadenopathy CARDIAC: RRR, no murmurs, rubs, gallops RESPIRATORY:  Clear to auscultation without rales, wheezing or rhonchi  ABDOMEN: Soft, non-tender, non-distended MUSCULOSKELETAL:  No edema; No deformity  SKIN: Warm and dry NEUROLOGIC:  Alert and oriented x 3 PSYCHIATRIC:  Normal affect   ASSESSMENT:    ?Stable Angina: CCS II. Notes chest pressure with exertion. He is intermediate risk for CVD. EKG shows possible old anterior infarct.  Ddx includes ischemic v anxiety (his wife is worried about this with dementia).  HLD: continue simvastatin 20 mg daily and zetia 10 mg daily, prior LDL < 100 mg/dL. PLAN:    In order of  problems listed above:  TTE SPECT lexiscan Follow up pending results      Shared Decision Making/Informed Consent The risks [chest pain, shortness of breath, cardiac arrhythmias, dizziness, blood pressure fluctuations, myocardial infarction, stroke/transient ischemic attack, nausea, vomiting, allergic reaction, radiation exposure, metallic taste sensation and life-threatening complications (estimated to be 1 in 10,000)], benefits (risk stratification, diagnosing coronary artery disease, treatment guidance) and alternatives of a nuclear stress test were  discussed in detail with Mr. Alves and he agrees to proceed.    Medication Adjustments/Labs and Tests Ordered: Current medicines are reviewed at length with the patient today.  Concerns regarding medicines are outlined above.  No orders of the defined types were placed in this encounter.  No orders of the defined types were placed in this encounter.   There are no Patient Instructions on file for this visit.   Signed, Janina Mayo, MD  02/16/2022 9:44 AM    [place]

## 2022-02-23 ENCOUNTER — Telehealth (HOSPITAL_COMMUNITY): Payer: Self-pay

## 2022-02-23 NOTE — Telephone Encounter (Signed)
Detailed instructions left on the patient's answering machine. Asked to call back with any questions. S.Niam Nepomuceno EMTP 

## 2022-02-25 ENCOUNTER — Ambulatory Visit (HOSPITAL_COMMUNITY): Payer: Medicare HMO | Attending: Internal Medicine

## 2022-02-25 DIAGNOSIS — R079 Chest pain, unspecified: Secondary | ICD-10-CM | POA: Diagnosis not present

## 2022-02-25 LAB — MYOCARDIAL PERFUSION IMAGING
Base ST Depression (mm): 0 mm
LV dias vol: 86 mL (ref 62–150)
LV sys vol: 38 mL
Nuc Stress EF: 56 %
Peak HR: 79 {beats}/min
Rest HR: 51 {beats}/min
Rest Nuclear Isotope Dose: 10.7 mCi
SDS: 0
SRS: 0
SSS: 0
ST Depression (mm): 0 mm
Stress Nuclear Isotope Dose: 31.8 mCi
TID: 1.18

## 2022-02-25 MED ORDER — REGADENOSON 0.4 MG/5ML IV SOLN
0.4000 mg | Freq: Once | INTRAVENOUS | Status: AC
  Administered 2022-02-25: 0.4 mg via INTRAVENOUS

## 2022-02-25 MED ORDER — TECHNETIUM TC 99M TETROFOSMIN IV KIT
10.7000 | PACK | Freq: Once | INTRAVENOUS | Status: AC | PRN
  Administered 2022-02-25: 10.7 via INTRAVENOUS

## 2022-02-25 MED ORDER — TECHNETIUM TC 99M TETROFOSMIN IV KIT
31.8000 | PACK | Freq: Once | INTRAVENOUS | Status: AC | PRN
  Administered 2022-02-25: 31.8 via INTRAVENOUS

## 2022-03-01 ENCOUNTER — Telehealth: Payer: Self-pay | Admitting: Family Medicine

## 2022-03-01 NOTE — Telephone Encounter (Signed)
Pt wife is calling he saw cardiologist and per wife Pamala Hurry everything checked out and she said md said she would increase his sertraline (ZOLOFT) 50 MG tablet chest discomfort maybe due to anxiety  [place], [address] Phone:  [phone]  Fax:  [phone]

## 2022-03-03 ENCOUNTER — Encounter: Payer: Self-pay | Admitting: Neurology

## 2022-03-03 ENCOUNTER — Encounter: Payer: Self-pay | Admitting: Family Medicine

## 2022-03-03 ENCOUNTER — Ambulatory Visit: Payer: Medicare HMO | Admitting: Neurology

## 2022-03-03 VITALS — BP 140/77 | HR 66 | Ht 66.0 in | Wt 164.0 lb

## 2022-03-03 DIAGNOSIS — F0280 Dementia in other diseases classified elsewhere without behavioral disturbance: Secondary | ICD-10-CM

## 2022-03-03 DIAGNOSIS — G309 Alzheimer's disease, unspecified: Secondary | ICD-10-CM | POA: Diagnosis not present

## 2022-03-03 DIAGNOSIS — R69 Illness, unspecified: Secondary | ICD-10-CM | POA: Diagnosis not present

## 2022-03-03 MED ORDER — DONEPEZIL HCL 10 MG PO TABS: ORAL_TABLET | ORAL | 3 refills | Status: DC
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
## 2022-03-03 NOTE — Patient Instructions (Signed)
Good to see you.  Continue Donepezil 10mg  daily  2. Discuss increasing Sertraline dose with Dr. Martinique  3. Follow-up with [facility] Sharene Butters in 6 months, call for any changes   FALL PRECAUTIONS: Be cautious when walking. Scan the area for obstacles that may increase the risk of trips and falls. When getting up in the mornings, sit up at the edge of the bed for a few minutes before getting out of bed. Consider elevating the bed at the head end to avoid drop of blood pressure when getting up. Walk always in a well-lit room (use night lights in the walls). Avoid area rugs or power cords from appliances in the middle of the walkways. Use a walker or a cane if necessary and consider physical therapy for balance exercise. Get your eyesight checked regularly.  HOME SAFETY: Consider the safety of the kitchen when operating appliances like stoves, microwave oven, and blender. Consider having supervision and share cooking responsibilities until no longer able to participate in those. Accidents with firearms and other hazards in the house should be identified and addressed as well.  DRIVING: Regarding driving, in patients with progressive memory problems, driving will be impaired. We advise to have someone else do the driving if trouble finding directions or if minor accidents are reported. Independent driving assessment is available to determine safety of driving.  ABILITY TO BE LEFT ALONE: If patient is unable to contact 911 operator, consider using LifeLine, or when the need is there, arrange for someone to stay with patients. Smoking is a fire hazard, consider supervision or cessation. Risk of wandering should be assessed by caregiver and if detected at any point, supervision and safe proof recommendations should be instituted.   RECOMMENDATIONS FOR ALL PATIENTS WITH MEMORY PROBLEMS: 1. Continue to exercise (Recommend 30 minutes of walking everyday, or 3 hours every week) 2. Increase  social interactions - continue going to [place] and enjoy social gatherings with friends and family 3. Eat healthy, avoid fried foods and eat more fruits and vegetables 4. Maintain adequate blood pressure, blood sugar, and blood cholesterol level. Reducing the risk of stroke and cardiovascular disease also helps promoting better memory. 5. Avoid stressful situations. Live a simple life and avoid aggravations. Organize your time and prepare for the next day in anticipation. 6. Sleep well, avoid any interruptions of sleep and avoid any distractions in the bedroom that may interfere with adequate sleep quality 7. Avoid sugar, avoid sweets as there is a strong link between excessive sugar intake, diabetes, and cognitive impairment The Mediterranean diet has been shown to help patients reduce the risk of progressive memory disorders and reduces cardiovascular risk. This includes eating fish, eat fruits and green leafy vegetables, nuts like almonds and hazelnuts, walnuts, and also use olive oil. Avoid fast foods and fried foods as much as possible. Avoid sweets and sugar as sugar use has been linked to worsening of memory function.  There is always a concern of gradual progression of memory problems. If this is the case, then we may need to adjust level of care according to patient needs. Support, both to the patient and caregiver, should then be put into place.

## 2022-03-06 ENCOUNTER — Other Ambulatory Visit: Payer: Self-pay | Admitting: Family Medicine

## 2022-03-06 DIAGNOSIS — F411 Generalized anxiety disorder: Secondary | ICD-10-CM

## 2022-03-06 MED ORDER — SERTRALINE HCL 100 MG PO TABS: 100.0000 mg | ORAL_TABLET | Freq: Every day | ORAL | 1 refills | Status: DC

## 2022-03-09 DIAGNOSIS — Z0100 Encounter for examination of eyes and vision without abnormal findings: Secondary | ICD-10-CM | POA: Diagnosis not present

## 2022-03-09 DIAGNOSIS — H524 Presbyopia: Secondary | ICD-10-CM | POA: Diagnosis not present

## 2022-03-22 ENCOUNTER — Encounter (INDEPENDENT_AMBULATORY_CARE_PROVIDER_SITE_OTHER): Payer: Self-pay

## 2022-04-07 ENCOUNTER — Other Ambulatory Visit: Payer: Self-pay | Admitting: Family Medicine

## 2022-04-07 DIAGNOSIS — E785 Hyperlipidemia, unspecified: Secondary | ICD-10-CM

## 2022-07-07 ENCOUNTER — Ambulatory Visit (INDEPENDENT_AMBULATORY_CARE_PROVIDER_SITE_OTHER): Payer: Medicare HMO

## 2022-07-07 VITALS — Ht 66.0 in | Wt 164.0 lb

## 2022-07-07 DIAGNOSIS — Z0000 Encounter for general adult medical examination without abnormal findings: Secondary | ICD-10-CM | POA: Diagnosis not present

## 2022-07-07 NOTE — Progress Notes (Signed)
Subjective:   Randy Bean is a 72 y.o. male who presents for Medicare Annual/Subsequent preventive examination.  Review of Systems    Virtual Visit via Telephone Note  I connected with  Randy Bean on 07/07/22 at 10:45 AM EST by telephone and verified that I am speaking with the correct person using two identifiers.  Location: Patient: Home Provider: Office Persons participating in the virtual visit: patient/Nurse Health Advisor   I discussed the limitations, risks, security and privacy concerns of performing an evaluation and management service by telephone and the availability of in person appointments. The patient expressed understanding and agreed to proceed.  Interactive audio and video telecommunications were attempted between this nurse and patient, however failed, due to patient having technical difficulties OR patient did not have access to video capability.  We continued and completed visit with audio only.  Some vital signs may be absent or patient reported.   Criselda Peaches, LPN  Cardiac Risk Factors include: advanced age (>8mn, >>42women);male gender     Objective:    Today's Vitals   07/07/22 1049  Weight: 164 lb (74.4 kg)  Height: 5' 6"$  (1.676 m)   Body mass index is 26.47 kg/m.     07/07/2022   10:58 AM 03/03/2022   10:06 AM 07/23/2021    8:38 AM 06/30/2021    1:40 PM 01/12/2021    3:03 PM 07/24/2020   10:13 AM 10/08/2019    8:56 AM  Advanced Directives  Does Patient Have a Medical Advance Directive? Yes Yes Yes Yes Yes Yes Yes  Type of [place]amed[place]of [place] will   [place] will [place] of facility DNR (pink MOST or yellow form) [place]Living will   Does patient want to make changes to medical advance directive? No - Patient declined     No - Patient declined   Copy of [place] Chart? Yes - validated most recent copy  scanned in chart (See row information)     No - copy requested     Current Medications (verified) Outpatient Encounter Medications as of 07/07/2022  Medication Sig   aspirin EC 81 MG tablet Take 81 mg by mouth daily. Swallow whole.   Coenzyme Q10 (COQ10) 100 MG CAPS Take 1 capsule by mouth daily.   donepezil (ARICEPT) 10 MG tablet Take 1 tablet daily   ezetimibe (ZETIA) 10 MG tablet TAKE ONE TABLET BY MOUTH ONE TIME DAILY   fexofenadine (ALLEGRA) 180 MG tablet    Flax OIL Take 1 tablet by mouth daily.   Garlic 1123XX123MG CAPS Take 1 capsule by mouth daily.   Omega 3 1200 MG CAPS Take 1 capsule by mouth daily.   sertraline (ZOLOFT) 100 MG tablet Take 1 tablet (100 mg total) by mouth daily.   simvastatin (ZOCOR) 20 MG tablet Take 1 tablet by mouth once daily   vitamin C (ASCORBIC ACID) 500 MG tablet Take 500 mg by mouth daily.   No facility-administered encounter medications on file as of 07/07/2022.    Allergies (verified) Patient has no known allergies.   History: Past Medical History:  Diagnosis Date   Allergy    Dementia due to Alzheimer's disease    Generalized anxiety disorder 03/31/2016   History of head injury    Hyperlipemia, mixed 03/31/2016   Major depressive disorder    History reviewed. No pertinent surgical history. Family History  Problem Relation Age of Onset  Mental illness Mother    Memory loss Mother    COPD Father    Memory loss Brother        Older half-brother   Social History   Socioeconomic History   Marital status: Married    Spouse name: Not on file   Number of children: Not on file   Years of education: 13   Highest education level: Some college, no degree  Occupational History   Occupation: retired  Tobacco Use   Smoking status: Former    Passive exposure: Past   Smokeless tobacco: Former    Types: Nurse, children's Use: Never used  Substance and Sexual Activity   Alcohol use: Yes    Alcohol/week: 7.0 standard drinks of  alcohol    Types: 7 Glasses of wine per week    Comment: 1 glass every evening   Drug use: Never   Sexual activity: Yes  Other Topics Concern   Not on file  Social History Narrative   Right handed   One story home   Lives with wife   Social Determinants of Health   Financial Resource Strain: Low Risk  (07/07/2022)   Overall Financial Resource Strain (CARDIA)    Difficulty of Paying Living Expenses: Not hard at all  Food Insecurity: No Food Insecurity (07/07/2022)   Hunger Vital Sign    Worried About Running Out of Food in the Last Year: Never true    Ran Out of Food in the Last Year: Never true  Transportation Needs: No Transportation Needs (07/07/2022)   PRAPARE - Hydrologist (Medical): No    Lack of Transportation (Non-Medical): No  Physical Activity: Sufficiently Active (07/07/2022)   Exercise Vital Sign    Days of Exercise per Week: 5 days    Minutes of Exercise per Session: 30 min  Stress: No Stress Concern Present (07/07/2022)   [place]    Feeling of Stress : Not at all  Social Connections: [place] (07/07/2022)   Social Connection and Isolation Panel [NHANES]    Frequency of Communication with Friends and Family: More than three times a week    Frequency of Social Gatherings with Friends and Family: More than three times a week    Attends Religious Services: More than 4 times per year    Active Member of [employer] or Organizations: Yes    Attends Music therapist: More than 4 times per year    Marital Status: Married    Tobacco Counseling Counseling given: Not Answered   Clinical Intake:  Pre-visit preparation completed: Yes  Pain : No/denies pain     BMI - recorded: 26.47 Nutritional Status: BMI 25 -29 Overweight Nutritional Risks: None Diabetes: No  How often do you need to have someone help you when you read instructions, pamphlets, or  other written materials from your doctor or pharmacy?: 1 - Never  Diabetic?  No  Interpreter Needed?: No  Information entered by :: Rolene Arbour LPN   Activities of Daily Living    07/07/2022   10:57 AM  In your present state of health, do you have any difficulty performing the following activities:  Hearing? 0  Vision? 0  Difficulty concentrating or making decisions? 0  Walking or climbing stairs? 0  Dressing or bathing? 0  Doing errands, shopping? 0  Preparing Food and eating ? N  Using the Toilet? N  In the past  six months, have you accidently leaked urine? N  Do you have problems with loss of bowel control? N  Managing your Medications? N  Managing your Finances? N  Housekeeping or managing your Housekeeping? N    Patient Care Team: [place], Betty G, MD as PCP - General (Family Medicine) Cameron Sprang, MD as Consulting Physician (Neurology)  Indicate any recent Medical Services you may have received from other than [facility] providers in the past year (date may be approximate).     Assessment:   This is a routine wellness examination for Polly.  Hearing/Vision screen Hearing Screening - Comments:: Denies hearing difficulties   Vision Screening - Comments:: Wears rx glasses - up to date with routine eye exams with  [employer]  Dietary issues and exercise activities discussed: Current Exercise Habits: Home exercise routine, Type of exercise: walking, Time (Minutes): 30, Frequency (Times/Week): 5, Weekly Exercise (Minutes/Week): 150, Intensity: Moderate, Exercise limited by: None identified   Goals Addressed               This Visit's Progress     Patient Stated (pt-stated)        I would like to continue to garden.       Depression Screen    07/07/2022   10:56 AM 01/15/2022   11:06 AM 09/04/2021    8:52 AM 06/30/2021    1:42 PM 07/24/2020   10:16 AM 10/08/2019    8:57 AM 07/12/2019   12:19 AM  PHQ 2/9 Scores  PHQ - 2 Score 0 0 0 0 0 0 0  PHQ- 9  Score  4         Fall Risk    07/07/2022   10:57 AM 03/03/2022   10:06 AM 01/15/2022   11:06 AM 09/04/2021    8:52 AM 07/23/2021    8:38 AM  Fall Risk   Falls in the past year? 0 0 1 0 0  Number falls in past yr: 0 0 1 0 0  Injury with Fall? 0 0 0 0 0  Risk for fall due to : No Fall Risks  History of fall(s) No Fall Risks   Follow up Falls prevention discussed Falls evaluation completed Falls evaluation completed Falls evaluation completed     [place]:  Any stairs in or around the home? Yes  If so, are there any without handrails? No  Home free of loose throw rugs in walkways, pet beds, electrical cords, etc? Yes  Adequate lighting in your home to reduce risk of falls? Yes   ASSISTIVE DEVICES UTILIZED TO PREVENT FALLS:  Life alert? No  Use of a cane, walker or w/c? No  Grab bars in the bathroom? No  Shower chair or bench in shower? Yes  Elevated toilet seat or a handicapped toilet? No   TIMED UP AND GO:  Was the test performed? No . Audio Visit   Cognitive Function:    01/12/2021    3:00 PM  MMSE - Mini Mental State Exam  Orientation to time 2  Orientation to Place 4  Registration 3  Attention/ Calculation 4  Recall 0  Language- name 2 objects 2  Language- repeat 1  Language- follow 3 step command 3  Language- read & follow direction 1  Write a sentence 1  Copy design 1  Total score 22        07/07/2022   10:59 AM  6CIT Screen  What Year? 4 points  What  month? 3 points  What time? 0 points  Count back from 20 0 points  Months in reverse 4 points  Repeat phrase 6 points  Total Score 17 points    Immunizations Immunization History  Administered Date(s) Administered   COVID-19, mRNA, vaccine(Comirnaty)12 years and older 03/10/2022   Fluad Quad(high Dose 65+) 02/05/2021, 02/09/2022   Hepatitis A 07/15/1994, 01/15/1995   Hepatitis B 07/15/1994, 09/15/1994, 01/15/1995   Influenza, High Dose Seasonal PF 03/31/2016,  02/09/2018, 01/30/2019, 02/25/2020   Influenza-Unspecified 03/21/2017, 02/09/2018   Meningococcal Conjugate 07/15/1994   Moderna Covid-19 Vaccine Bivalent Booster 5yr & up 03/03/2021   PFIZER(Purple Top)SARS-COV-2 Vaccination 07/05/2019, 07/28/2019, 03/03/2020, 10/01/2020   Pneumococcal Conjugate-13 02/28/2019   Pneumococcal Polysaccharide-23 04/01/2017, 07/13/2021   Tdap 02/27/2007, 07/13/2021   Zoster Recombinat (Shingrix) 12/09/2017, 12/09/2017, 02/09/2018   Zoster, Live 02/12/2014    TDAP status: Up to date  Flu Vaccine status: Up to date  Pneumococcal vaccine status: Up to date  Covid-19 vaccine status: Completed vaccines  Qualifies for Shingles Vaccine? Yes   Zostavax completed Yes   Shingrix Completed?: Yes  Screening Tests Health Maintenance  Topic Date Due   Medicare Annual Wellness (AWV)  07/08/2023   COLONOSCOPY (Pts [phone] coverage will need to be confirmed)  05/24/2024   DTaP/Tdap/Td (3 - Td or Tdap) 07/14/2031   Pneumonia Vaccine 6560Years old  Completed   INFLUENZA VACCINE  Completed   COVID-19 Vaccine  Completed   Hepatitis C Screening  Completed   Zoster Vaccines- Shingrix  Completed   HPV VACCINES  Aged Out    Health Maintenance  There are no preventive care reminders to display for this patient.   Colorectal cancer screening: Type of screening: Colonoscopy. Completed 05/24/14. Repeat every 10 years  Lung Cancer Screening: (Low Dose CT Chest recommended if Age 74-80ears, 30 pack-year currently smoking OR have quit w/in 15years.) does not qualify.  Additional Screening:  Hepatitis C Screening: does qualify; Completed 04/01/17  Vision Screening: Recommended annual ophthalmology exams for early detection of glaucoma and other disorders of the eye. Is the patient up to date with their annual eye exam?  Yes  Who is the provider or what is the name of the office in which the patient attends annual eye exams? [facility] is not  established with a provider, would they like to be referred to a provider to establish care? No .   Dental Screening: Recommended annual dental exams for proper oral hygiene  Community Resource Referral / Chronic Care Management: CRR required this visit?  No   CCM required this visit?  No      Plan:     I have personally reviewed and noted the following in the patient's chart:   Medical and social history Use of alcohol, tobacco or illicit drugs  Current medications and supplements including opioid prescriptions. Patient is not currently taking opioid prescriptions. Functional ability and status Nutritional status Physical activity Advanced directives List of other physicians Hospitalizations, surgeries, and ER visits in previous 12 months Vitals Screenings to include cognitive, depression, and falls Referrals and appointments  In addition, I have reviewed and discussed with patient certain preventive protocols, quality metrics, and best practice recommendations. A written personalized care plan for preventive services as well as general preventive health recommendations were provided to patient.     BeCriselda PeachesLPN   [id number] Nurse Notes: None

## 2022-07-07 NOTE — Patient Instructions (Addendum)
[name] , Thank you for taking time to come for your Medicare Wellness Visit. I appreciate your ongoing commitment to your health goals. Please review the following plan we discussed and let me know if I can assist you in the future.   These are the goals we discussed:  Goals       Patient Stated (pt-stated)      I would like to continue to garden.      Patient Stated      06/30/2021, work on drinking water        This is a list of the screening recommended for you and due dates:  Health Maintenance  Topic Date Due   Medicare Annual Wellness Visit  07/08/2023   Colon Cancer Screening  05/24/2024   DTaP/Tdap/Td vaccine (3 - Td or Tdap) 07/14/2031   Pneumonia Vaccine  Completed   Flu Shot  Completed   COVID-19 Vaccine  Completed   Hepatitis C Screening: USPSTF Recommendation to screen - Ages 84-74 yo.  Completed   Zoster (Shingles) Vaccine  Completed   HPV Vaccine  Aged Out    Advanced directives: In Chart  Conditions/risks identified: None  Next appointment: Follow up in one year for your annual wellness visit.    Preventive Care 74 Years and Older, Male  Preventive care refers to lifestyle choices and visits with your health care provider that can promote health and wellness. What does preventive care include? A yearly physical exam. This is also called an annual well check. Dental exams once or twice a year. Routine eye exams. Ask your health care provider how often you should have your eyes checked. Personal lifestyle choices, including: Daily care of your teeth and gums. Regular physical activity. Eating a healthy diet. Avoiding tobacco and drug use. Limiting alcohol use. Practicing safe sex. Taking low doses of aspirin every day. Taking vitamin and mineral supplements as recommended by your health care provider. What happens during an annual well check? The services and screenings done by your health care provider during your annual well check will depend on  your age, overall health, lifestyle risk factors, and family history of disease. Counseling  Your health care provider may ask you questions about your: Alcohol use. Tobacco use. Drug use. Emotional well-being. Home and relationship well-being. Sexual activity. Eating habits. History of falls. Memory and ability to understand (cognition). Work and work Statistician. Screening  You may have the following tests or measurements: Height, weight, and BMI. Blood pressure. Lipid and cholesterol levels. These may be checked every 5 years, or more frequently if you are over 62 years old. Skin check. Lung cancer screening. You may have this screening every year starting at age 74 if you have a 30-pack-year history of smoking and currently smoke or have quit within the past 15 years. Fecal occult blood test (FOBT) of the stool. You may have this test every year starting at age 74. Flexible sigmoidoscopy or colonoscopy. You may have a sigmoidoscopy every 5 years or a colonoscopy every 10 years starting at age 74. Prostate cancer screening. Recommendations will vary depending on your family history and other risks. Hepatitis C blood test. Hepatitis B blood test. Sexually transmitted disease (STD) testing. Diabetes screening. This is done by checking your blood sugar (glucose) after you have not eaten for a while (fasting). You may have this done every 1-3 years. Abdominal aortic aneurysm (AAA) screening. You may need this if you are a current or former smoker. Osteoporosis. You may be  screened starting at age 74 if you are at high risk. Talk with your health care provider about your test results, treatment options, and if necessary, the need for more tests. Vaccines  Your health care provider may recommend certain vaccines, such as: Influenza vaccine. This is recommended every year. Tetanus, diphtheria, and acellular pertussis (Tdap, Td) vaccine. You may need a Td booster every 10 years. Zoster  vaccine. You may need this after age 74. Pneumococcal 13-valent conjugate (PCV13) vaccine. One dose is recommended after age 74. Pneumococcal polysaccharide (PPSV23) vaccine. One dose is recommended after age 74. Talk to your health care provider about which screenings and vaccines you need and how often you need them. This information is not intended to replace advice given to you by your health care provider. Make sure you discuss any questions you have with your health care provider. Document Released: 06/06/2015 Document Revised: 01/28/2016 Document Reviewed: 03/11/2015 Elsevier Interactive Patient Education  2017 [place] Prevention in the Home Falls can cause injuries. They can happen to people of all ages. There are many things you can do to make your home safe and to help prevent falls. What can I do on the outside of my home? Regularly fix the edges of walkways and driveways and fix any cracks. Remove anything that might make you trip as you walk through a door, such as a raised step or threshold. Trim any bushes or trees on the path to your home. Use bright outdoor lighting. Clear any walking paths of anything that might make someone trip, such as rocks or tools. Regularly check to see if handrails are loose or broken. Make sure that both sides of any steps have handrails. Any raised decks and porches should have guardrails on the edges. Have any leaves, snow, or ice cleared regularly. Use sand or salt on walking paths during winter. Clean up any spills in your garage right away. This includes oil or grease spills. What can I do in the bathroom? Use night lights. Install grab bars by the toilet and in the tub and shower. Do not use towel bars as grab bars. Use non-skid mats or decals in the tub or shower. If you need to sit down in the shower, use a plastic, non-slip stool. Keep the floor dry. Clean up any water that spills on the floor as soon as it happens. Remove  soap buildup in the tub or shower regularly. Attach bath mats securely with double-sided non-slip rug tape. Do not have throw rugs and other things on the floor that can make you trip. What can I do in the bedroom? Use night lights. Make sure that you have a light by your bed that is easy to reach. Do not use any sheets or blankets that are too big for your bed. They should not hang down onto the floor. Have a firm chair that has side arms. You can use this for support while you get dressed. Do not have throw rugs and other things on the floor that can make you trip. What can I do in the kitchen? Clean up any spills right away. Avoid walking on wet floors. Keep items that you use a lot in easy-to-reach places. If you need to reach something above you, use a strong step stool that has a grab bar. Keep electrical cords out of the way. Do not use floor polish or wax that makes floors slippery. If you must use wax, use non-skid floor wax. Do not have  throw rugs and other things on the floor that can make you trip. What can I do with my stairs? Do not leave any items on the stairs. Make sure that there are handrails on both sides of the stairs and use them. Fix handrails that are broken or loose. Make sure that handrails are as long as the stairways. Check any carpeting to make sure that it is firmly attached to the stairs. Fix any carpet that is loose or worn. Avoid having throw rugs at the top or bottom of the stairs. If you do have throw rugs, attach them to the floor with carpet tape. Make sure that you have a light switch at the top of the stairs and the bottom of the stairs. If you do not have them, ask someone to add them for you. What else can I do to help prevent falls? Wear shoes that: Do not have high heels. Have rubber bottoms. Are comfortable and fit you well. Are closed at the toe. Do not wear sandals. If you use a stepladder: Make sure that it is fully opened. Do not climb a  closed stepladder. Make sure that both sides of the stepladder are locked into place. Ask someone to hold it for you, if possible. Clearly mark and make sure that you can see: Any grab bars or handrails. First and last steps. Where the edge of each step is. Use tools that help you move around (mobility aids) if they are needed. These include: Canes. Walkers. Scooters. Crutches. Turn on the lights when you go into a dark area. Replace any light bulbs as soon as they burn out. Set up your furniture so you have a clear path. Avoid moving your furniture around. If any of your floors are uneven, fix them. If there are any pets around you, be aware of where they are. Review your medicines with your doctor. Some medicines can make you feel dizzy. This can increase your chance of falling. Ask your doctor what other things that you can do to help prevent falls. This information is not intended to replace advice given to you by your health care provider. Make sure you discuss any questions you have with your health care provider. Document Released: 03/06/2009 Document Revised: 10/16/2015 Document Reviewed: 06/14/2014 Elsevier Interactive Patient Education  2017 [employer].

## 2022-08-02 NOTE — Progress Notes (Signed)
ACUTE VISIT Chief Complaint  Patient presents with   pain in calf muscle    Started about a week ago; traveling to [facility] and had to stop on the way there and the way back due to calf cramping.    HPI: Randy Bean is a 72 y.o. male with past medical history significant for GAD, hyperlipidemia, and Alzheimer's disease here today with his wife, who is concerned about frequent left LE pain. He does not recall having cramps, so it is difficult to obtain details about pain. His wife provides Hx. Left calf pain that started about a week ago during a car trip to the beach. The pain was gradual and did not improve with movement in the car.  Leg Pain  The incident occurred 5 to 7 days ago. The injury mechanism is unknown. The pain is moderate. The pain has been Intermittent since onset. Pertinent negatives include no inability to bear weight, loss of motion, loss of sensation, muscle weakness, numbness or tingling. The symptoms are aggravated by movement.  His wife noted that walking seemed to alleviate the pain, and he experienced no issues during the week at the beach. However, they had to stop four times on the return trip due to his leg pain.  Negative for associated LE edema or erythema. Negative for associated chest pain, SOB, or palpitations. He experiences leg cramps occasionally, such as "charley horses", while sitting at home or upon waking in the morning. He is maintaining his usual fluid and food intake.  GAD: He has increased sertraline dose from 50 mg to 100 mg, his wife decreased dose to 75 mg daily because higher dose seemed to aggravate anxiety and gradually increased it to 100 mg. Episodes of chest discomfort have decreased since Sertraline was adjusted. 02/2022 negative lexiscan stress test. 01/2022 echo LVEF 70-75% and grade I diastolic dysfunction.  Alzheimer's disease gradually getting worse, follows with neurologist. Last visit 03/03/22. He is on Donepezil 10 mg  daily.  Review of Systems  Constitutional:  Negative for chills and fever.  Gastrointestinal:  Negative for abdominal pain, nausea and vomiting.  Genitourinary:  Negative for decreased urine volume, dysuria and hematuria.  Skin:  Negative for rash.  Neurological:  Negative for tingling, syncope, weakness and numbness.  See other pertinent positives and negatives in HPI.  Current Outpatient Medications on File Prior to Visit  Medication Sig Dispense Refill   aspirin EC 81 MG tablet Take 81 mg by mouth daily. Swallow whole.     Coenzyme Q10 (COQ10) 100 MG CAPS Take 1 capsule by mouth daily.     donepezil (ARICEPT) 10 MG tablet Take 1 tablet daily 90 tablet 3   ezetimibe (ZETIA) 10 MG tablet TAKE ONE TABLET BY MOUTH ONE TIME DAILY 90 tablet 3   fexofenadine (ALLEGRA) 180 MG tablet      Flax OIL Take 1 tablet by mouth daily.     Garlic 123XX123 MG CAPS Take 1 capsule by mouth daily.     Omega 3 1200 MG CAPS Take 1 capsule by mouth daily.     sertraline (ZOLOFT) 100 MG tablet Take 1 tablet (100 mg total) by mouth daily. 90 tablet 1   simvastatin (ZOCOR) 20 MG tablet Take 1 tablet by mouth once daily 90 tablet 2   vitamin C (ASCORBIC ACID) 500 MG tablet Take 500 mg by mouth daily.     No current facility-administered medications on file prior to visit.    Past Medical History:  Diagnosis Date  Allergy    Dementia due to Alzheimer's disease    Generalized anxiety disorder 03/31/2016   History of head injury    Hyperlipemia, mixed 03/31/2016   Major depressive disorder    No Known Allergies  Social History   Socioeconomic History   Marital status: Married    Spouse name: Not on file   Number of children: Not on file   Years of education: 13   Highest education level: 12th grade  Occupational History   Occupation: retired  Tobacco Use   Smoking status: Former    Passive exposure: Past   Smokeless tobacco: Former    Types: Nurse, children's Use: Never used  Substance  and Sexual Activity   Alcohol use: Yes    Alcohol/week: 7.0 standard drinks of alcohol    Types: 7 Glasses of wine per week    Comment: 1 glass every evening   Drug use: Never   Sexual activity: Yes  Other Topics Concern   Not on file  Social History Narrative   Right handed   One story home   Lives with wife   Social Determinants of Health   Financial Resource Strain: Low Risk  (08/03/2022)   Overall Financial Resource Strain (CARDIA)    Difficulty of Paying Living Expenses: Not hard at all  Food Insecurity: No Food Insecurity (08/03/2022)   Hunger Vital Sign    Worried About Running Out of Food in the Last Year: Never true    Ran Out of Food in the Last Year: Never true  Transportation Needs: No Transportation Needs (08/03/2022)   PRAPARE - Hydrologist (Medical): No    Lack of Transportation (Non-Medical): No  Physical Activity: Insufficiently Active (08/03/2022)   Exercise Vital Sign    Days of Exercise per Week: 5 days    Minutes of Exercise per Session: 20 min  Stress: No Stress Concern Present (08/03/2022)   [place]    Feeling of Stress : Only a little  Social Connections: Moderately Integrated (08/03/2022)   Social Connection and Isolation Panel [NHANES]    Frequency of Communication with Friends and Family: Once a week    Frequency of Social Gatherings with Friends and Family: Once a week    Attends Religious Services: More than 4 times per year    Active Member of [employer] or Organizations: Yes    Attends Archivist Meetings: 1 to 4 times per year    Marital Status: Married   Vitals:   08/03/22 0955  BP: 126/70  Pulse: 70  Resp: 16  SpO2: 98%   Body mass index is 26.17 kg/m.  Physical Exam Vitals and nursing note reviewed.  Constitutional:      General: He is not in acute distress.    Appearance: He is well-developed.  HENT:     Head: Normocephalic and  atraumatic.  Eyes:     Conjunctiva/sclera: Conjunctivae normal.  Cardiovascular:     Rate and Rhythm: Normal rate and regular rhythm.     Pulses:          Dorsalis pedis pulses are 2+ on the right side and 2+ on the left side.     Heart sounds: No murmur heard. Pulmonary:     Effort: Pulmonary effort is normal. No respiratory distress.     Breath sounds: Normal breath sounds.  Abdominal:     Palpations: Abdomen is soft. There  is no mass.     Tenderness: There is no abdominal tenderness.  Musculoskeletal:     Right lower leg: No tenderness. No edema.     Left lower leg: No tenderness. No edema.  Skin:    General: Skin is warm.     Findings: No erythema or rash.  Neurological:     General: No focal deficit present.     Mental Status: He is alert. Mental status is at baseline.     Gait: Gait normal.  Psychiatric:        Mood and Affect: Affect normal. Mood is anxious.   ASSESSMENT AND PLAN:  Mr. Cahn was seen today for LLE pain.  Lab Results  Component Value Date   CREATININE 0.76 08/03/2022   BUN 18 08/03/2022   NA 140 08/03/2022   K 4.1 08/03/2022   CL 103 08/03/2022   CO2 29 08/03/2022   Pain of left lower extremity Hx and examination do not suggest a serious process. I do not think imaging is needed at this time.  -     Basic metabolic panel; Future  Leg cramps We discussed possible causes. Continue adequate hydration. Further recommendations according to lab results.  -     Iron -     Ferritin  Generalized anxiety disorder Improved with increasing dose of Sertraline. Continue Sertraline 100 mg daily.  Dementia due to Alzheimer's disease Gradually getting worse. He is on Donepezil 10 mg daily. Follows with neurologist.  Return if symptoms worsen or fail to improve, for keep next appointment.  Dennard Vezina G. [place], MD  [facility]. [place] office.

## 2022-08-03 ENCOUNTER — Ambulatory Visit (INDEPENDENT_AMBULATORY_CARE_PROVIDER_SITE_OTHER): Payer: Medicare HMO | Admitting: Family Medicine

## 2022-08-03 VITALS — BP 126/70 | HR 70 | Resp 16 | Ht 66.0 in | Wt 162.1 lb

## 2022-08-03 DIAGNOSIS — G309 Alzheimer's disease, unspecified: Secondary | ICD-10-CM | POA: Diagnosis not present

## 2022-08-03 DIAGNOSIS — F411 Generalized anxiety disorder: Secondary | ICD-10-CM | POA: Diagnosis not present

## 2022-08-03 DIAGNOSIS — M79605 Pain in left leg: Secondary | ICD-10-CM | POA: Diagnosis not present

## 2022-08-03 DIAGNOSIS — R69 Illness, unspecified: Secondary | ICD-10-CM | POA: Diagnosis not present

## 2022-08-03 DIAGNOSIS — R252 Cramp and spasm: Secondary | ICD-10-CM | POA: Diagnosis not present

## 2022-08-03 DIAGNOSIS — F0280 Dementia in other diseases classified elsewhere without behavioral disturbance: Secondary | ICD-10-CM

## 2022-08-03 LAB — BASIC METABOLIC PANEL
BUN: 18 mg/dL (ref 6–23)
CO2: 29 mEq/L (ref 19–32)
Calcium: 10.1 mg/dL (ref 8.4–10.5)
Chloride: 103 mEq/L (ref 96–112)
Creatinine, Ser: 0.76 mg/dL (ref 0.40–1.50)
GFR: 88.77 mL/min (ref >60.00)
Glucose, Bld: 91 mg/dL (ref 70–99)
Potassium: 4.1 mEq/L (ref 3.5–5.1)
Sodium: 140 mEq/L (ref 135–145)

## 2022-08-03 LAB — FERRITIN: Ferritin: 149.5 ng/mL (ref 22.0–322.0)

## 2022-08-03 LAB — IRON: Iron: 156 ug/dL (ref 42–165)

## 2022-08-03 NOTE — Patient Instructions (Addendum)
A few things to remember from today's visit:  Leg cramp - Plan: Basic metabolic panel  Generalized anxiety disorder Examination today does not suggest a serious process. Monitor for new symptoms.  If you need refills for medications you take chronically, please call your pharmacy. Do not use My Chart to request refills or for acute issues that need immediate attention. If you send a my chart message, it may take a few days to be addressed, specially if I am not in the office.  Please be sure medication list is accurate. If a new problem present, please set up appointment sooner than planned today.

## 2022-08-07 NOTE — Assessment & Plan Note (Signed)
Gradually getting worse. He is on Donepezil 10 mg daily. Follows with neurologist.

## 2022-08-07 NOTE — Assessment & Plan Note (Signed)
Improved with increasing dose of Sertraline. Continue Sertraline 100 mg daily.

## 2022-08-26 ENCOUNTER — Encounter: Payer: Self-pay | Admitting: Neurology

## 2022-09-03 NOTE — Progress Notes (Unsigned)
HPI: Randy Bean is a 72 y.o.male with PMHx significant for hyperlipidemia, anxiety, and Alzheimer's disease here today with his wife for his routine physical examination. His wife provides history today.  Last CPE: 09/04/21  He engages in regular exercise, such as walking a few times during the week, and cooks at home with a focus on daily vegetable and fruit consumption. He admits to enjoying ice cream,daily, about a cup. Sleeps about 8 to 9 hours. Former smoker. He does not consume alcohol frequently.  Since his last visit he has follow-up with neurology, Dr. Karel Jarvis, for Alzheimer's disease.Problem is gradually getting worse with episodes of confusion in the morning when he wakes up. He sees neurologist q 6 months.  Immunization History  Administered Date(s) Administered   COVID-19, mRNA, vaccine(Comirnaty)12 years and older 03/10/2022   Fluad Quad(high Dose 65+) 02/05/2021, 02/09/2022   Hepatitis A 07/15/1994, 01/15/1995   Hepatitis B 07/15/1994, 09/15/1994, 01/15/1995   Influenza, High Dose Seasonal PF 03/31/2016, 02/09/2018, 01/30/2019, 02/25/2020   Influenza-Unspecified 03/21/2017, 02/09/2018   Meningococcal Conjugate 07/15/1994   Moderna Covid-19 Vaccine Bivalent Booster 3yrs & up 03/03/2021   PFIZER(Purple Top)SARS-COV-2 Vaccination 07/05/2019, 07/28/2019, 03/03/2020, 10/01/2020   Pneumococcal Conjugate-13 02/28/2019   Pneumococcal Polysaccharide-23 04/01/2017, 07/13/2021   Tdap 02/27/2007, 07/13/2021   Zoster Recombinat (Shingrix) 12/09/2017, 12/09/2017, 02/09/2018   Zoster, Live 02/12/2014    Health Maintenance  Topic Date Due   INFLUENZA VACCINE  12/23/2022   Medicare Annual Wellness (AWV)  07/08/2023   COLONOSCOPY (Pts 45-108yrs Insurance coverage will need to be confirmed)  05/24/2024   DTaP/Tdap/Td (3 - Td or Tdap) 07/14/2031   Pneumonia Vaccine 99+ Years old  Completed   COVID-19 Vaccine  Completed   Hepatitis C Screening  Completed   Zoster  Vaccines- Shingrix  Completed   HPV VACCINES  Aged Out   Hyperlipidemia: Currently he is on Zetia 10 mg daily and simvastatin 20 mg daily. Tolerating medication well. Lab Results  Component Value Date   CHOL 168 09/04/2021   HDL 43.00 09/04/2021   LDLCALC 89 09/01/2020   LDLDIRECT 91.0 09/04/2021   TRIG 246.0 (H) 09/04/2021   CHOLHDL 4 09/04/2021   He was seen on 08/03/22 for LE, left calf pain, which has resolved. Lab Results  Component Value Date   CREATININE 0.76 08/03/2022   BUN 18 08/03/2022   NA 140 08/03/2022   K 4.1 08/03/2022   CL 103 08/03/2022   CO2 29 08/03/2022   Anxiety: he is on sertraline 100 mg daily. Problem is exacerbated by his memory difficulties.  Review of Systems  Constitutional:  Negative for chills and fever.  HENT:  Negative for facial swelling, mouth sores, nosebleeds, sore throat and trouble swallowing.   Eyes:  Negative for redness and visual disturbance.  Respiratory:  Negative for cough, shortness of breath and wheezing.   Cardiovascular:  Negative for chest pain and leg swelling.  Gastrointestinal:  Negative for abdominal pain, nausea and vomiting.  Endocrine: Negative for cold intolerance, heat intolerance, polydipsia, polyphagia and polyuria.  Allergic/Immunologic: Positive for environmental allergies.  Neurological:  Negative for syncope and headaches.  Psychiatric/Behavioral:  Negative for agitation and hallucinations.   All other systems reviewed and are negative.  Current Outpatient Medications on File Prior to Visit  Medication Sig Dispense Refill   aspirin EC 81 MG tablet Take 81 mg by mouth daily. Swallow whole.     Coenzyme Q10 (COQ10) 100 MG CAPS Take 1 capsule by mouth daily.     donepezil (  ARICEPT) 10 MG tablet Take 1 tablet daily 90 tablet 3   ezetimibe (ZETIA) 10 MG tablet TAKE ONE TABLET BY MOUTH ONE TIME DAILY 90 tablet 3   fexofenadine (ALLEGRA) 180 MG tablet      Flax OIL Take 1 tablet by mouth daily.     Garlic 1000  MG CAPS Take 1 capsule by mouth daily.     Omega 3 1200 MG CAPS Take 1 capsule by mouth daily.     sertraline (ZOLOFT) 100 MG tablet Take 1 tablet (100 mg total) by mouth daily. 90 tablet 1   simvastatin (ZOCOR) 20 MG tablet Take 1 tablet by mouth once daily 90 tablet 2   vitamin C (ASCORBIC ACID) 500 MG tablet Take 500 mg by mouth daily.     No current facility-administered medications on file prior to visit.   Past Medical History:  Diagnosis Date   Allergy    Dementia due to Alzheimer's disease    Generalized anxiety disorder 03/31/2016   History of head injury    Hyperlipemia, mixed 03/31/2016   Major depressive disorder    History reviewed. No pertinent surgical history.  No Known Allergies  Family History  Problem Relation Age of Onset   Mental illness Mother    Memory loss Mother    COPD Father    Memory loss Brother        Older half-brother   Social History   Socioeconomic History   Marital status: Married    Spouse name: Not on file   Number of children: Not on file   Years of education: 13   Highest education level: 12th grade  Occupational History   Occupation: retired  Tobacco Use   Smoking status: Former    Passive exposure: Past   Smokeless tobacco: Former    Types: Associate Professor Use: Never used  Substance and Sexual Activity   Alcohol use: Yes    Alcohol/week: 7.0 standard drinks of alcohol    Types: 7 Glasses of wine per week    Comment: 1 glass every evening   Drug use: Never   Sexual activity: Yes  Other Topics Concern   Not on file  Social History Narrative   Right handed   One story home   Lives with wife   Social Determinants of Health   Financial Resource Strain: Low Risk  (08/03/2022)   Overall Financial Resource Strain (CARDIA)    Difficulty of Paying Living Expenses: Not hard at all  Food Insecurity: No Food Insecurity (08/03/2022)   Hunger Vital Sign    Worried About Running Out of Food in the Last Year: Never  true    Ran Out of Food in the Last Year: Never true  Transportation Needs: No Transportation Needs (08/03/2022)   PRAPARE - Administrator, Civil Service (Medical): No    Lack of Transportation (Non-Medical): No  Physical Activity: Insufficiently Active (08/03/2022)   Exercise Vital Sign    Days of Exercise per Week: 5 days    Minutes of Exercise per Session: 20 min  Stress: No Stress Concern Present (08/03/2022)   [employer] of Occupational Health - Occupational Stress Questionnaire    Feeling of Stress : Only a little  Social Connections: Moderately Integrated (08/03/2022)   Social Connection and Isolation Panel [NHANES]    Frequency of Communication with Friends and Family: Once a week    Frequency of Social Gatherings with Friends and Family: Once a week  Attends Religious Services: More than 4 times per year    Active Member of Clubs or Organizations: Yes    Attends Banker Meetings: 1 to 4 times per year    Marital Status: Married   Vitals:   09/06/22 0955  BP: 110/64  Pulse: 64  Resp: 16  Temp: 98.4 F (36.9 C)  SpO2: 98%   Body mass index is 25.74 kg/m.  Wt Readings from Last 3 Encounters:  09/06/22 159 lb 8 oz (72.3 kg)  08/03/22 162 lb 2 oz (73.5 kg)  07/07/22 164 lb (74.4 kg)   Physical Exam Vitals and nursing note reviewed.  Constitutional:      General: He is not in acute distress.    Appearance: He is well-developed.  HENT:     Head: Normocephalic and atraumatic.     Right Ear: External ear normal.     Left Ear: External ear normal.     Ears:     Comments: Excess cerumen, L>R, right TM seen partially. Could not see left TM.    Mouth/Throat:     Mouth: Mucous membranes are moist.  Eyes:     Conjunctiva/sclera: Conjunctivae normal.     Pupils: Pupils are equal, round, and reactive to light.  Neck:     Thyroid: No thyroid mass.  Cardiovascular:     Rate and Rhythm: Normal rate and regular rhythm.     Pulses:           Posterior tibial pulses are 2+ on the right side and 2+ on the left side.     Heart sounds: No murmur heard. Pulmonary:     Effort: Pulmonary effort is normal. No respiratory distress.     Breath sounds: Normal breath sounds.  Abdominal:     Palpations: Abdomen is soft. There is no hepatomegaly or mass.     Tenderness: There is no abdominal tenderness.  Genitourinary:    Comments: No concerns. Musculoskeletal:        General: No tenderness.     Cervical back: Normal range of motion.     Right lower leg: No edema.     Left lower leg: No edema.     Comments: No signs of synovitis.  Lymphadenopathy:     Cervical: No cervical adenopathy.  Skin:    General: Skin is warm.     Findings: No erythema.  Neurological:     General: No focal deficit present.     Mental Status: He is alert. Mental status is at baseline.     Cranial Nerves: No cranial nerve deficit.     Gait: Gait normal.     Deep Tendon Reflexes:     Reflex Scores:      Bicep reflexes are 2+ on the right side and 2+ on the left side.      Patellar reflexes are 2+ on the right side and 2+ on the left side. Psychiatric:        Mood and Affect: Mood and affect normal.   ASSESSMENT AND PLAN:  RandyJuan was seen today for annual exam.  Diagnoses and all orders for this visit: Orders Placed This Encounter  Procedures   Hepatic function panel   Lipid panel   Lab Results  Component Value Date   ALT 36 09/06/2022   AST 27 09/06/2022   ALKPHOS 55 09/06/2022   BILITOT 0.5 09/06/2022   Lab Results  Component Value Date   CHOL 149 09/06/2022   HDL 47.30 09/06/2022  LDLCALC 64 09/06/2022   LDLDIRECT 91.0 09/04/2021   TRIG 188.0 (H) 09/06/2022   CHOLHDL 3 09/06/2022   Routine general medical examination at a health care facility Assessment & Plan: We discussed the importance of regular physical activity and healthy diet for prevention of chronic illness and/or complications. Preventive guidelines  reviewed. Vaccination up-to-date. Next CPE in a year.   Hyperlipemia, mixed Assessment & Plan: Continue simvastatin 20 mg daily and Zetia 10 mg daily. Low-fat diet is also recommended. Further recommendation will be given according to lipid panel result.  Orders: -     Hepatic function panel; Future -     Lipid panel; Future  Generalized anxiety disorder Assessment & Plan: Problem is stable. Continue Sertraline 100 mg daily. As far as no new symptoms present, annual follow-up is appropriate.   Return in 1 year (on 09/06/2023) for CPE, Labs.  Ashritha Desrosiers G. [place], MD  [facility]. [place] office.

## 2022-09-06 ENCOUNTER — Other Ambulatory Visit: Payer: Self-pay | Admitting: Family Medicine

## 2022-09-06 ENCOUNTER — Encounter: Payer: Self-pay | Admitting: Family Medicine

## 2022-09-06 ENCOUNTER — Ambulatory Visit (INDEPENDENT_AMBULATORY_CARE_PROVIDER_SITE_OTHER): Payer: Medicare HMO | Admitting: Family Medicine

## 2022-09-06 VITALS — BP 110/64 | HR 64 | Temp 98.4°F | Resp 16 | Ht 66.0 in | Wt 159.5 lb

## 2022-09-06 DIAGNOSIS — E782 Mixed hyperlipidemia: Secondary | ICD-10-CM

## 2022-09-06 DIAGNOSIS — F411 Generalized anxiety disorder: Secondary | ICD-10-CM

## 2022-09-06 DIAGNOSIS — Z0000 Encounter for general adult medical examination without abnormal findings: Secondary | ICD-10-CM

## 2022-09-06 DIAGNOSIS — R69 Illness, unspecified: Secondary | ICD-10-CM | POA: Diagnosis not present

## 2022-09-06 LAB — LIPID PANEL
Cholesterol: 149 mg/dL (ref 0–200)
HDL: 47.3 mg/dL (ref >39.00)
LDL Cholesterol: 64 mg/dL (ref 0–99)
NonHDL: 102.03
Total CHOL/HDL Ratio: 3
Triglycerides: 188 mg/dL — ABNORMAL HIGH (ref 0.0–149.0)
VLDL: 37.6 mg/dL (ref 0.0–40.0)

## 2022-09-06 LAB — HEPATIC FUNCTION PANEL
ALT: 36 U/L (ref 0–53)
AST: 27 U/L (ref 0–37)
Albumin: 4.9 g/dL (ref 3.5–5.2)
Alkaline Phosphatase: 55 U/L (ref 39–117)
Bilirubin, Direct: 0.1 mg/dL (ref 0.0–0.3)
Total Bilirubin: 0.5 mg/dL (ref 0.2–1.2)
Total Protein: 7.6 g/dL (ref 6.0–8.3)

## 2022-09-06 NOTE — Assessment & Plan Note (Signed)
Continue simvastatin 20 mg daily and Zetia 10 mg daily. Low-fat diet is also recommended. Further recommendation will be given according to lipid panel result.

## 2022-09-06 NOTE — Assessment & Plan Note (Signed)
Problem is stable. Continue Sertraline 100 mg daily. As far as no new symptoms present, annual follow-up is appropriate.

## 2022-09-06 NOTE — Assessment & Plan Note (Signed)
We discussed the importance of regular physical activity and healthy diet for prevention of chronic illness and/or complications. Preventive guidelines reviewed. Vaccination up-to-date. Next CPE in a year. 

## 2022-09-06 NOTE — Patient Instructions (Addendum)
A few things to remember from today's visit:  Routine general medical examination at a health care facility  Hyperlipemia, mixed - Plan: Hepatic function panel, Lipid panel  Generalized anxiety disorder No changes today.  If you need refills for medications you take chronically, please call your pharmacy. Do not use My Chart to request refills or for acute issues that need immediate attention. If you send a my chart message, it may take a few days to be addressed, specially if I am not in the office.  Please be sure medication list is accurate. If a new problem present, please set up appointment sooner than planned today.

## 2022-09-16 ENCOUNTER — Ambulatory Visit: Payer: Medicare HMO | Admitting: Neurology

## 2022-09-16 ENCOUNTER — Encounter: Payer: Self-pay | Admitting: Neurology

## 2022-09-16 VITALS — BP 151/70 | HR 69 | Wt 166.2 lb

## 2022-09-16 DIAGNOSIS — G309 Alzheimer's disease, unspecified: Secondary | ICD-10-CM

## 2022-09-16 DIAGNOSIS — F0280 Dementia in other diseases classified elsewhere without behavioral disturbance: Secondary | ICD-10-CM

## 2022-09-16 DIAGNOSIS — R69 Illness, unspecified: Secondary | ICD-10-CM | POA: Diagnosis not present

## 2022-09-16 MED ORDER — MEMANTINE HCL 10 MG PO TABS: ORAL_TABLET | ORAL | 3 refills | Status: DC

## 2022-09-16 MED ORDER — DONEPEZIL HCL 10 MG PO TABS: ORAL_TABLET | ORAL | 3 refills | Status: DC

## 2022-09-16 NOTE — Patient Instructions (Signed)
Good to see you.  Start Memantine : take 1 tablet every night for 2 weeks, then increase to 1 tablet twice a day  2. Continue Donepezil  daily  3. The DMV is pretty strict in patients driving with memory problems. Either Britta Mccreedy does all the driving moving forward, or schedule driving evaluation  [employer] in [place] [phone]  [facility] [phone]  [facility] [phone]  [facility] [phone] or [phone]  4. Follow-up in 6 months, call for any changes   FALL PRECAUTIONS: Be cautious when walking. Scan the area for obstacles that may increase the risk of trips and falls. When getting up in the mornings, sit up at the edge of the bed for a few minutes before getting out of bed. Consider elevating the bed at the head end to avoid drop of blood pressure when getting up. Walk always in a well-lit room (use night lights in the walls). Avoid area rugs or power cords from appliances in the middle of the walkways. Use a walker or a cane if necessary and consider physical therapy for balance exercise. Get your eyesight checked regularly.  FINANCIAL OVERSIGHT: Supervision, especially oversight when making financial decisions or transactions is also recommended.  HOME SAFETY: Consider the safety of the kitchen when operating appliances like stoves, microwave oven, and blender. Consider having supervision and share cooking responsibilities until no longer able to participate in those. Accidents with firearms and other hazards in the house should be identified and addressed as well.  DRIVING: Regarding driving, in patients with progressive memory problems, driving will be impaired. We advise to have someone else do the driving if trouble finding directions or if minor accidents are reported. Independent driving assessment is available to determine safety of driving.  ABILITY TO BE LEFT ALONE: If patient is unable to contact  911 operator, consider using LifeLine, or when the need is there, arrange for someone to stay with patients. Smoking is a fire hazard, consider supervision or cessation. Risk of wandering should be assessed by caregiver and if detected at any point, supervision and safe proof recommendations should be instituted.  MEDICATION SUPERVISION: Inability to self-administer medication needs to be constantly addressed. Implement a mechanism to ensure safe administration of the medications.  RECOMMENDATIONS FOR ALL PATIENTS WITH MEMORY PROBLEMS: 1. Continue to exercise (Recommend 30 minutes of walking everyday, or 3 hours every week) 2. Increase social interactions - continue going to [place] and enjoy social gatherings with friends and family 3. Eat healthy, avoid fried foods and eat more fruits and vegetables 4. Maintain adequate blood pressure, blood sugar, and blood cholesterol level. Reducing the risk of stroke and cardiovascular disease also helps promoting better memory. 5. Avoid stressful situations. Live a simple life and avoid aggravations. Organize your time and prepare for the next day in anticipation. 6. Sleep well, avoid any interruptions of sleep and avoid any distractions in the bedroom that may interfere with adequate sleep quality 7. Avoid sugar, avoid sweets as there is a strong link between excessive sugar intake, diabetes, and cognitive impairment The Mediterranean diet has been shown to help patients reduce the risk of progressive memory disorders and reduces cardiovascular risk. This includes eating fish, eat fruits and green leafy vegetables, nuts like almonds and hazelnuts, walnuts, and also use olive oil. Avoid fast foods and fried foods as much as possible. Avoid sweets and sugar as sugar use has been linked to worsening of memory function.  There is always a concern of  gradual progression of memory problems. If this is the case, then we may need to adjust level of care according to  patient needs. Support, both to the patient and caregiver, should then be put into place.
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
## 2022-12-31 ENCOUNTER — Other Ambulatory Visit: Payer: Self-pay | Admitting: Family Medicine

## 2022-12-31 DIAGNOSIS — E785 Hyperlipidemia, unspecified: Secondary | ICD-10-CM

## 2023-01-03 ENCOUNTER — Other Ambulatory Visit: Payer: Self-pay | Admitting: Family Medicine

## 2023-01-03 DIAGNOSIS — E785 Hyperlipidemia, unspecified: Secondary | ICD-10-CM

## 2023-02-11 DIAGNOSIS — L821 Other seborrheic keratosis: Secondary | ICD-10-CM | POA: Diagnosis not present

## 2023-02-11 DIAGNOSIS — Z85828 Personal history of other malignant neoplasm of skin: Secondary | ICD-10-CM | POA: Diagnosis not present

## 2023-02-11 DIAGNOSIS — C4441 Basal cell carcinoma of skin of scalp and neck: Secondary | ICD-10-CM | POA: Diagnosis not present

## 2023-02-11 DIAGNOSIS — L570 Actinic keratosis: Secondary | ICD-10-CM | POA: Diagnosis not present

## 2023-02-11 DIAGNOSIS — D2271 Melanocytic nevi of right lower limb, including hip: Secondary | ICD-10-CM | POA: Diagnosis not present

## 2023-02-11 DIAGNOSIS — L218 Other seborrheic dermatitis: Secondary | ICD-10-CM | POA: Diagnosis not present

## 2023-02-11 DIAGNOSIS — L918 Other hypertrophic disorders of the skin: Secondary | ICD-10-CM | POA: Diagnosis not present

## 2023-02-11 DIAGNOSIS — C44519 Basal cell carcinoma of skin of other part of trunk: Secondary | ICD-10-CM | POA: Diagnosis not present

## 2023-02-11 DIAGNOSIS — D224 Melanocytic nevi of scalp and neck: Secondary | ICD-10-CM | POA: Diagnosis not present

## 2023-02-23 ENCOUNTER — Ambulatory Visit: Payer: Medicare HMO | Admitting: Family Medicine

## 2023-02-23 ENCOUNTER — Encounter: Payer: Self-pay | Admitting: Family Medicine

## 2023-02-23 VITALS — BP 120/70 | HR 70 | Resp 16 | Ht 66.0 in | Wt 165.1 lb

## 2023-02-23 DIAGNOSIS — R0789 Other chest pain: Secondary | ICD-10-CM

## 2023-02-23 DIAGNOSIS — M151 Heberden's nodes (with arthropathy): Secondary | ICD-10-CM | POA: Diagnosis not present

## 2023-02-23 DIAGNOSIS — R0609 Other forms of dyspnea: Secondary | ICD-10-CM | POA: Diagnosis not present

## 2023-02-23 DIAGNOSIS — F411 Generalized anxiety disorder: Secondary | ICD-10-CM | POA: Diagnosis not present

## 2023-02-23 MED ORDER — ALBUTEROL SULFATE HFA 108 (90 BASE) MCG/ACT IN AERS
2.0000 | INHALATION_SPRAY | Freq: Four times a day (QID) | RESPIRATORY_TRACT | 0 refills | Status: DC | PRN
Start: 2023-02-23 — End: 2024-01-06

## 2023-02-23 MED ORDER — BUSPIRONE HCL 5 MG PO TABS
5.0000 mg | ORAL_TABLET | Freq: Two times a day (BID) | ORAL | 2 refills | Status: DC
Start: 2023-02-23 — End: 2023-05-16

## 2023-02-23 NOTE — Patient Instructions (Addendum)
A few things to remember from today's visit:  DOE (dyspnea on exertion) - Plan: albuterol (VENTOLIN HFA) 108 (90 Base) MCG/ACT inhaler  Generalized anxiety disorder - Plan: busPIRone (BUSPAR) 5 MG tablet  Albuterol inh 1-2 puff 15-20 min before exercising. Buspar 5 mg 2 times daily added. No changes in sertraline.  If you need refills for medications you take chronically, please call your pharmacy. Do not use My Chart to request refills or for acute issues that need immediate attention. If you send a my chart message, it may take a few days to be addressed, specially if I am not in the office.  Please be sure medication list is accurate. If a new problem present, please set up appointment sooner than planned today.
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
## 2023-02-24 NOTE — Assessment & Plan Note (Signed)
This has been intermittent for several months. Cardiac work up later last year negative. We discussed possible causes, anxiety can certainly be a contributing factor. I do not think further work up is needed at this time. Instructed about warning signs.

## 2023-03-18 ENCOUNTER — Ambulatory Visit: Payer: Medicare HMO | Admitting: Neurology

## 2023-03-18 ENCOUNTER — Encounter: Payer: Self-pay | Admitting: Neurology

## 2023-03-18 VITALS — BP 149/74 | HR 65 | Ht 66.0 in | Wt 168.2 lb

## 2023-03-18 DIAGNOSIS — G309 Alzheimer's disease, unspecified: Secondary | ICD-10-CM | POA: Diagnosis not present

## 2023-03-18 DIAGNOSIS — F0280 Dementia in other diseases classified elsewhere without behavioral disturbance: Secondary | ICD-10-CM

## 2023-03-18 MED ORDER — DONEPEZIL HCL 10 MG PO TABS: ORAL_TABLET | ORAL | 3 refills | Status: DC

## 2023-03-18 MED ORDER — MEMANTINE HCL 10 MG PO TABS: ORAL_TABLET | ORAL | 3 refills | Status: DC

## 2023-03-18 NOTE — Patient Instructions (Signed)
Good to see you. Continue Donepezil 10mg  daily and Memantine 10mg  twice a day. Continue working with Dr. [place] on the anxiety. Follow-up in 6 months, call for any changes.   FALL PRECAUTIONS: Be cautious when walking. Scan the area for obstacles that may increase the risk of trips and falls. When getting up in the mornings, sit up at the edge of the bed for a few minutes before getting out of bed. Consider elevating the bed at the head end to avoid drop of blood pressure when getting up. Walk always in a well-lit room (use night lights in the walls). Avoid area rugs or power cords from appliances in the middle of the walkways. Use a walker or a cane if necessary and consider physical therapy for balance exercise. Get your eyesight checked regularly.   HOME SAFETY: Consider the safety of the kitchen when operating appliances like stoves, microwave oven, and blender. Consider having supervision and share cooking responsibilities until no longer able to participate in those. Accidents with firearms and other hazards in the house should be identified and addressed as well.  ABILITY TO BE LEFT ALONE: If patient is unable to contact 911 operator, consider using LifeLine, or when the need is there, arrange for someone to stay with patients. Smoking is a fire hazard, consider supervision or cessation. Risk of wandering should be assessed by caregiver and if detected at any point, supervision and safe proof recommendations should be instituted.   RECOMMENDATIONS FOR ALL PATIENTS WITH MEMORY PROBLEMS: 1. Continue to exercise (Recommend 30 minutes of walking everyday, or 3 hours every week) 2. Increase social interactions - continue going to [place] and enjoy social gatherings with friends and family 3. Eat healthy, avoid fried foods and eat more fruits and vegetables 4. Maintain adequate blood pressure, blood sugar, and blood cholesterol level. Reducing the risk of stroke and cardiovascular disease also helps  promoting better memory. 5. Avoid stressful situations. Live a simple life and avoid aggravations. Organize your time and prepare for the next day in anticipation. 6. Sleep well, avoid any interruptions of sleep and avoid any distractions in the bedroom that may interfere with adequate sleep quality 7. Avoid sugar, avoid sweets as there is a strong link between excessive sugar intake, diabetes, and cognitive impairment The Mediterranean diet has been shown to help patients reduce the risk of progressive memory disorders and reduces cardiovascular risk. This includes eating fish, eat fruits and green leafy vegetables, nuts like almonds and hazelnuts, walnuts, and also use olive oil. Avoid fast foods and fried foods as much as possible. Avoid sweets and sugar as sugar use has been linked to worsening of memory function.  There is always a concern of gradual progression of memory problems. If this is the case, then we may need to adjust level of care according to patient needs. Support, both to the patient and caregiver, should then be put into place.
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
## 2023-05-14 ENCOUNTER — Other Ambulatory Visit: Payer: Self-pay | Admitting: Family Medicine

## 2023-05-14 DIAGNOSIS — F411 Generalized anxiety disorder: Secondary | ICD-10-CM

## 2023-05-16 ENCOUNTER — Other Ambulatory Visit: Payer: Self-pay | Admitting: Family Medicine

## 2023-05-16 DIAGNOSIS — F411 Generalized anxiety disorder: Secondary | ICD-10-CM

## 2023-05-30 ENCOUNTER — Ambulatory Visit: Payer: Medicare HMO | Admitting: Family Medicine

## 2023-06-08 ENCOUNTER — Encounter: Payer: Self-pay | Admitting: Family Medicine

## 2023-06-08 ENCOUNTER — Ambulatory Visit (INDEPENDENT_AMBULATORY_CARE_PROVIDER_SITE_OTHER): Payer: Medicare HMO | Admitting: Family Medicine

## 2023-06-08 VITALS — BP 122/70 | HR 75 | Resp 16 | Ht 66.0 in | Wt 165.5 lb

## 2023-06-08 DIAGNOSIS — F0280 Dementia in other diseases classified elsewhere without behavioral disturbance: Secondary | ICD-10-CM | POA: Diagnosis not present

## 2023-06-08 DIAGNOSIS — R0789 Other chest pain: Secondary | ICD-10-CM

## 2023-06-08 DIAGNOSIS — G309 Alzheimer's disease, unspecified: Secondary | ICD-10-CM

## 2023-06-08 DIAGNOSIS — F411 Generalized anxiety disorder: Secondary | ICD-10-CM

## 2023-06-08 MED ORDER — BUSPIRONE HCL 7.5 MG PO TABS
7.5000 mg | ORAL_TABLET | Freq: Three times a day (TID) | ORAL | 1 refills | Status: DC
Start: 2023-06-08 — End: 2023-09-14

## 2023-06-08 NOTE — Patient Instructions (Signed)
 A few things to remember from today's visit:  Generalized anxiety disorder - Plan: busPIRone  (BUSPAR ) 7.5 MG tablet Buspar  increased from 5 mg 2 times daily to 7.5 mg 3 times daily. No changes in Sertraline  dose. Keeps next appt.  If you need refills for medications you take chronically, please call your pharmacy. Do not use My Chart to request refills or for acute issues that need immediate attention. If you send a my chart message, it may take a few days to be addressed, specially if I am not in the office.  Please be sure medication list is accurate. If a new problem present, please set up appointment sooner than planned today.

## 2023-06-08 NOTE — Progress Notes (Signed)
 HPI: Randy Bean is a 73 y.o. male with a PMHx significant for HLD, anxiety, and Alzheimer's disease, who is here today with his wife for medication follow up.  Last seen on 02/23/2023, when Buspar  5 mg 2 times daily was added due to worsening anxiety.  He is also on sertraline  100 mg daily. He has tolerated medication well, denies side effects.  Since his last visit, his wife says his anxiety episodes have decreased in frequency, but he still has them occasionally: Chest discomfort and occasionally SOB.  No associated diaphoresis, nausea, or syncope.  He has seen cardiologist in the past, myocardial perfusion/Lexiscan  stress test normal in 02/2022 and echo done in 01/2022.   Alzheimer's disease: Currently he is on Aricept  10 mg daily and Namenda  10 mg twice daily. Next appointment with neurology is 10/19/2023. He sees them every 6 months.   Review of Systems  Constitutional:  Negative for activity change, appetite change, chills and fever.  Respiratory:  Negative for cough and shortness of breath.   Gastrointestinal:  Negative for abdominal pain, nausea and vomiting.  Genitourinary:  Negative for decreased urine volume, dysuria and hematuria.  Neurological:  Negative for weakness and headaches.  Psychiatric/Behavioral:  Negative for hallucinations. The patient is nervous/anxious.   See other pertinent positives and negatives in HPI.  Current Outpatient Medications on File Prior to Visit  Medication Sig Dispense Refill   albuterol  (VENTOLIN  HFA) 108 (90 Base) MCG/ACT inhaler Inhale 2 puffs into the lungs every 6 (six) hours as needed for wheezing or shortness of breath. 8 g 0   aspirin EC 81 MG tablet Take 81 mg by mouth daily. Swallow whole.     Coenzyme Q10 (COQ10) 100 MG CAPS Take 1 capsule by mouth daily.     donepezil  (ARICEPT ) 10 MG tablet Take 1 tablet daily 90 tablet 3   ezetimibe  (ZETIA ) 10 MG tablet TAKE ONE TABLET BY MOUTH ONE TIME DAILY 90 tablet 3   fexofenadine  (ALLEGRA) 180 MG tablet      Flax OIL Take 1 tablet by mouth daily.     Garlic 1000 MG CAPS Take 1 capsule by mouth daily.     memantine  (NAMENDA ) 10 MG tablet Take 1 tablet twice a day 180 tablet 3   Multiple Vitamins-Minerals (MULTIVITAMIN WITH MINERALS) tablet Take 1 tablet by mouth daily.     Omega 3 1200 MG CAPS Take 1 capsule by mouth daily.     sertraline  (ZOLOFT ) 100 MG tablet Take 1 tablet by mouth once daily 90 tablet 3   simvastatin  (ZOCOR ) 20 MG tablet Take 1 tablet by mouth once daily 90 tablet 3   vitamin C (ASCORBIC ACID) 500 MG tablet Take 500 mg by mouth daily.     No current facility-administered medications on file prior to visit.    Past Medical History:  Diagnosis Date   Allergy    Dementia due to Alzheimer's disease    Generalized anxiety disorder 03/31/2016   History of head injury    Hyperlipemia, mixed 03/31/2016   Major depressive disorder    No Known Allergies  Social History   Socioeconomic History   Marital status: Married    Spouse name: Not on file   Number of children: Not on file   Years of education: 13   Highest education level: Some college, no degree  Occupational History   Occupation: retired  Tobacco Use   Smoking status: Former    Passive exposure: Past   Smokeless tobacco: Former  Types: Chew  Vaping Use   Vaping status: Never Used  Substance and Sexual Activity   Alcohol use: Yes    Alcohol/week: 7.0 standard drinks of alcohol    Types: 7 Glasses of wine per week    Comment: 1 glass every evening   Drug use: Never   Sexual activity: Yes  Other Topics Concern   Not on file  Social History Narrative   Right handed   One story home   Lives with wife   Social Drivers of Health   Financial Resource Strain: Low Risk  (05/28/2023)   Overall Financial Resource Strain (CARDIA)    Difficulty of Paying Living Expenses: Not hard at all  Food Insecurity: No Food Insecurity (05/28/2023)   Hunger Vital Sign    Worried About  Running Out of Food in the Last Year: Never true    Ran Out of Food in the Last Year: Never true  Transportation Needs: No Transportation Needs (05/28/2023)   PRAPARE - Administrator, Civil Service (Medical): No    Lack of Transportation (Non-Medical): No  Physical Activity: Insufficiently Active (05/28/2023)   Exercise Vital Sign    Days of Exercise per Week: 4 days    Minutes of Exercise per Session: 20 min  Stress: Stress Concern Present (05/28/2023)   [employer] of Occupational Health - Occupational Stress Questionnaire    Feeling of Stress : To some extent  Social Connections: Moderately Integrated (05/28/2023)   Social Connection and Isolation Panel [NHANES]    Frequency of Communication with Friends and Family: Once a week    Frequency of Social Gatherings with Friends and Family: Once a week    Attends Religious Services: More than 4 times per year    Active Member of [employer] or Organizations: Yes    Attends Banker Meetings: More than 4 times per year    Marital Status: Married    Vitals:   06/08/23 0804  BP: 122/70  Pulse: 75  Resp: 16  SpO2: 94%   Body mass index is 26.71 kg/m.  Physical Exam Vitals and nursing note reviewed.  Constitutional:      General: He is not in acute distress.    Appearance: He is well-developed.  HENT:     Head: Normocephalic and atraumatic.     Mouth/Throat:     Pharynx: Uvula midline.  Eyes:     Conjunctiva/sclera: Conjunctivae normal.  Cardiovascular:     Rate and Rhythm: Normal rate and regular rhythm.     Heart sounds: No murmur heard. Pulmonary:     Effort: Pulmonary effort is normal. No respiratory distress.     Breath sounds: Normal breath sounds.  Lymphadenopathy:     Cervical: No cervical adenopathy.  Skin:    General: Skin is warm.     Findings: No erythema or rash.  Neurological:     General: No focal deficit present.     Mental Status: He is alert. Mental status is at baseline.      Cranial Nerves: No cranial nerve deficit.     Gait: Gait normal.  Psychiatric:     Comments: Well groomed, good eye contact.    ASSESSMENT AND PLAN:  Randy Bean was seen today for medication follow up.   Chest discomfort Assessment & Plan: Seems to be aggravated by anxiety. Improved after adding BuSpar . Cardiac workup done in 2023 (stress test and echo) otherwise negative.   Generalized anxiety disorder Assessment & Plan: Problem has improved  but he still having some episodes of anxiety attacks. BuSpar  increased from 5 mg twice daily to 7.5 mg 3 times daily. For now continue sertraline  100 mg daily but we can consider decreasing dose in the future. I will see him back for his CPE visit, 08/2023.  Orders: -     busPIRone  HCl; Take 1 tablet (7.5 mg total) by mouth 3 (three) times daily.  Dispense: 270 tablet; Refill: 1  Dementia due to Alzheimer's disease [facility]) Assessment & Plan: Otherwise stable since his last visit. He is on Donepezil  10 mg daily and Namenda  10 mg twice daily. Follows with neurologist every 6 months.   Return if symptoms worsen or fail to improve, for keep next appointment.  I, Fritz Jewel Wierda, acting as a scribe for Shela Esses [place], MD., have documented all relevant documentation on the behalf of Laresha Bacorn [place], MD, as directed by  Ariyona Eid [place], MD while in the presence of Eddye Broxterman [place], MD.   I, Canden Cieslinski [place], MD, have reviewed all documentation for this visit. The documentation on 06/08/23 for the exam, diagnosis, procedures, and orders are all accurate and complete.  Darryl Blumenstein G. [place], MD  [facility] office.

## 2023-06-08 NOTE — Assessment & Plan Note (Signed)
 Otherwise stable since his last visit. He is on Donepezil  10 mg daily and Namenda  10 mg twice daily. Follows with neurologist every 6 months.

## 2023-06-08 NOTE — Assessment & Plan Note (Addendum)
 Problem has improved but he still having some episodes of anxiety attacks. BuSpar  increased from 5 mg twice daily to 7.5 mg 3 times daily. For now continue sertraline  100 mg daily but we can consider decreasing dose in the future. I will see him back for his CPE visit, 08/2023.

## 2023-06-08 NOTE — Assessment & Plan Note (Addendum)
 Seems to be aggravated by anxiety. Improved after adding BuSpar . Cardiac workup done in 2023 (stress test and echo) otherwise negative.

## 2023-07-18 ENCOUNTER — Ambulatory Visit: Payer: Medicare HMO

## 2023-07-18 VITALS — Ht 66.0 in | Wt 165.0 lb

## 2023-07-18 DIAGNOSIS — Z0000 Encounter for general adult medical examination without abnormal findings: Secondary | ICD-10-CM | POA: Diagnosis not present

## 2023-07-18 NOTE — Patient Instructions (Addendum)
 Randy Bean , Thank you for taking time to come for your Medicare Wellness Visit. I appreciate your ongoing commitment to your health goals. Please review the following plan we discussed and let me know if I can assist you in the future.   Referrals/Orders/Follow-Ups/Clinician Recommendations:   This is a list of the screening recommended for you and due dates:  Health Maintenance  Topic Date Due   COVID-19 Vaccine (7 - 2024-25 season) 01/23/2023   Colon Cancer Screening  05/24/2024   Medicare Annual Wellness Visit  07/17/2024   DTaP/Tdap/Td vaccine (3 - Td or Tdap) 07/14/2031   Pneumonia Vaccine  Completed   Flu Shot  Completed   Hepatitis C Screening  Completed   Zoster (Shingles) Vaccine  Completed   HPV Vaccine  Aged Out    Advanced directives: (In Chart) A copy of your advanced directives are scanned into your chart should your provider ever need it.  Next Medicare Annual Wellness Visit scheduled for next year: Yes

## 2023-07-18 NOTE — Progress Notes (Signed)
 Subjective:   Randy Bean is a 73 y.o. male who presents for Medicare Annual/Subsequent preventive examination.  Visit Complete: Virtual I connected with  [name] on 07/18/23 by a audio enabled telemedicine application and verified that I am speaking with the correct person using two identifiers.  Patient Location: Home  Provider Location: Home Office  I discussed the limitations of evaluation and management by telemedicine. The patient expressed understanding and agreed to proceed.  Vital Signs: Because this visit was a virtual/telehealth visit, some criteria may be missing or patient reported. Any vitals not documented were not able to be obtained and vitals that have been documented are patient reported.  Patient Medicare AWV questionnaire was completed by the patient on 07/11/23; I have confirmed that all information answered by patient is correct and no changes since this date.  Cardiac Risk Factors include: advanced age (>62men, >91 women);male gender     Objective:    Today's Vitals   07/18/23 1027  Weight: 165 lb (74.8 kg)  Height: 5\' 6"  (1.676 m)   Body mass index is 26.63 kg/m.     07/18/2023   10:41 AM 03/18/2023   11:30 AM 09/16/2022    8:32 AM 07/07/2022   10:58 AM 03/03/2022   10:06 AM 07/23/2021    8:38 AM 06/30/2021    1:40 PM  Advanced Directives  Does Patient Have a Medical Advance Directive? Yes Yes Yes Yes Yes Yes Yes  Type of Estate agent of [place];Living will Healthcare Power of [place];Living will  Healthcare Power of [place];Living will   Healthcare Power of [place];Living will  Does patient want to make changes to medical advance directive? No - Patient declined   No - Patient declined     Copy of Healthcare Power of Attorney in Chart? Yes - validated most recent copy scanned in chart (See row information)   Yes - validated most recent copy scanned in chart (See row information)       Current Medications  (verified) Outpatient Encounter Medications as of 07/18/2023  Medication Sig   albuterol (VENTOLIN HFA) 108 (90 Base) MCG/ACT inhaler Inhale 2 puffs into the lungs every 6 (six) hours as needed for wheezing or shortness of breath.   aspirin EC 81 MG tablet Take 81 mg by mouth daily. Swallow whole.   busPIRone (BUSPAR) 7.5 MG tablet Take 1 tablet (7.5 mg total) by mouth 3 (three) times daily.   Coenzyme Q10 (COQ10) 100 MG CAPS Take 1 capsule by mouth daily.   donepezil (ARICEPT) 10 MG tablet Take 1 tablet daily   ezetimibe (ZETIA) 10 MG tablet TAKE ONE TABLET BY MOUTH ONE TIME DAILY   fexofenadine (ALLEGRA) 180 MG tablet    Flax OIL Take 1 tablet by mouth daily.   Garlic 1000 MG CAPS Take 1 capsule by mouth daily.   memantine (NAMENDA) 10 MG tablet Take 1 tablet twice a day   Multiple Vitamins-Minerals (MULTIVITAMIN WITH MINERALS) tablet Take 1 tablet by mouth daily.   Omega 3 1200 MG CAPS Take 1 capsule by mouth daily.   sertraline (ZOLOFT) 100 MG tablet Take 1 tablet by mouth once daily   simvastatin (ZOCOR) 20 MG tablet Take 1 tablet by mouth once daily   vitamin C (ASCORBIC ACID) 500 MG tablet Take 500 mg by mouth daily.   No facility-administered encounter medications on file as of 07/18/2023.    Allergies (verified) Patient has no known allergies.   History: Past Medical History:  Diagnosis Date  Allergy    Dementia due to Alzheimer's disease    Generalized anxiety disorder 03/31/2016   History of head injury    Hyperlipemia, mixed 03/31/2016   Major depressive disorder    History reviewed. No pertinent surgical history. Family History  Problem Relation Age of Onset   Mental illness Mother    Memory loss Mother    COPD Father    Memory loss Brother        Older half-brother   Social History   Socioeconomic History   Marital status: Married    Spouse name: Not on file   Number of children: Not on file   Years of education: 13   Highest education level: Some  college, no degree  Occupational History   Occupation: retired  Tobacco Use   Smoking status: Former    Passive exposure: Past   Smokeless tobacco: Former    Types: Engineer, drilling   Vaping status: Never Used  Substance and Sexual Activity   Alcohol use: Yes    Alcohol/week: 7.0 standard drinks of alcohol    Types: 7 Glasses of wine per week    Comment: 1 glass every evening   Drug use: Never   Sexual activity: Yes  Other Topics Concern   Not on file  Social History Narrative   Right handed   One story home   Lives with wife   Social Drivers of Health   Financial Resource Strain: Low Risk  (07/18/2023)   Overall Financial Resource Strain (CARDIA)    Difficulty of Paying Living Expenses: Not hard at all  Food Insecurity: No Food Insecurity (07/18/2023)   Hunger Vital Sign    Worried About Running Out of Food in the Last Year: Never true    Ran Out of Food in the Last Year: Never true  Transportation Needs: No Transportation Needs (07/18/2023)   PRAPARE - Administrator, Civil Service (Medical): No    Lack of Transportation (Non-Medical): No  Physical Activity: Insufficiently Active (07/18/2023)   Exercise Vital Sign    Days of Exercise per Week: 3 days    Minutes of Exercise per Session: 30 min  Stress: No Stress Concern Present (07/18/2023)   [employer] of Occupational Health - Occupational Stress Questionnaire    Feeling of Stress : Not at all  Recent Concern: Stress - Stress Concern Present (05/28/2023)   [employer] of Occupational Health - Occupational Stress Questionnaire    Feeling of Stress : To some extent  Social Connections: Socially Integrated (07/18/2023)   Social Connection and Isolation Panel [NHANES]    Frequency of Communication with Friends and Family: More than three times a week    Frequency of Social Gatherings with Friends and Family: More than three times a week    Attends Religious Services: More than 4 times per year     Active Member of [employer] or Organizations: Yes    Attends Engineer, structural: More than 4 times per year    Marital Status: Married    Tobacco Counseling Counseling given: Not Answered   Clinical Intake:  Pre-visit preparation completed: Yes  Pain : No/denies pain     BMI - recorded: 26.63 Nutritional Status: BMI 25 -29 Overweight Nutritional Risks: None Diabetes: No  How often do you need to have someone help you when you read instructions, pamphlets, or other written materials from your doctor or pharmacy?: 1 - Never  Interpreter Needed?: No  Information entered by ::  Theresa Mulligan LPN   Activities of Daily Living    07/18/2023   10:38 AM 07/11/2023    1:22 PM  In your present state of health, do you have any difficulty performing the following activities:  Hearing? 0 0  Vision? 0 0  Difficulty concentrating or making decisions? 1 1  Comment Dx: Dementia   Walking or climbing stairs? 0 0  Dressing or bathing? 0 0  Doing errands, shopping? 1 1  Comment Dx: Dementia   Preparing Food and eating ? N N  Using the Toilet? N N  In the past six months, have you accidently leaked urine? N N  Do you have problems with loss of bowel control? N N  Managing your Medications? Malvin Johns  Comment Wife assist   Managing your Finances? Malvin Johns  Comment Wife assist   Housekeeping or managing your Housekeeping? N N    Patient Care Team: [place], Betty G, MD as PCP - General (Family Medicine) Van Clines, MD as Consulting Physician (Neurology)  Indicate any recent Medical Services you may have received from other than [facility] providers in the past year (date may be approximate).     Assessment:   This is a routine wellness examination for Jaquise.  Hearing/Vision screen Hearing Screening - Comments:: Denies hearing difficulties   Vision Screening - Comments:: Wears rx glasses - up to date with routine eye exams with  [facility]   Goals Addressed                This Visit's Progress     Patient stated (pt-stated)        Continue to work in garden.       Depression Screen    07/18/2023   10:37 AM 02/23/2023   10:37 AM 09/06/2022   10:03 AM 07/07/2022   10:56 AM 01/15/2022   11:06 AM 09/04/2021    8:52 AM 06/30/2021    1:42 PM  PHQ 2/9 Scores  PHQ - 2 Score 0 1 1 0 0 0 0  PHQ- 9 Score  4 2  4       Fall Risk    07/18/2023   10:38 AM 07/11/2023    1:22 PM 03/18/2023   11:30 AM 02/23/2023   10:37 AM 09/16/2022    8:32 AM  Fall Risk   Falls in the past year? 0 0 0 0 0  Number falls in past yr: 0  0 0 0  Injury with Fall? 0  0 0 0  Risk for fall due to : No Fall Risks   Other (Comment)   Follow up Falls prevention discussed;Falls evaluation completed  Falls evaluation completed Falls evaluation completed Falls evaluation completed    MEDICARE RISK AT HOME: Medicare Risk at Home Any stairs in or around the home?: Yes If so, are there any without handrails?: No Home free of loose throw rugs in walkways, pet beds, electrical cords, etc?: No Adequate lighting in your home to reduce risk of falls?: Yes Life alert?: No Use of a cane, walker or w/c?: No Grab bars in the bathroom?: No Shower chair or bench in shower?: Yes Elevated toilet seat or a handicapped toilet?: Yes  TIMED UP AND GO:  Was the test performed?  No    Cognitive Function:    09/16/2022    8:00 AM 01/12/2021    3:00 PM  MMSE - Mini Mental State Exam  Orientation to time 1 2  Orientation to Place 2 4  Registration 3 3  Attention/ Calculation 0 4  Recall 0 0  Language- name 2 objects 2 2  Language- repeat 1 1  Language- follow 3 step command 3 3  Language- read & follow direction 1 1  Write a sentence 1 1  Copy design 0 1  Total score 14 22        07/18/2023   10:41 AM 07/07/2022   10:59 AM  6CIT Screen  What Year? 4 points 4 points  What month? 3 points 3 points  What time? 3 points 0 points  Count back from 20 4 points 0 points  Months in reverse 4 points  4 points  Repeat phrase 0 points 6 points  Total Score 18 points 17 points    Immunizations Immunization History  Administered Date(s) Administered   Fluad Quad(high Dose 65+) 02/05/2021, 02/09/2022   Hepatitis A 07/15/1994, 01/15/1995   Hepatitis B 07/15/1994, 09/15/1994, 01/15/1995   Influenza, High Dose Seasonal PF 03/31/2016, 02/09/2018, 01/30/2019, 02/25/2020   Influenza-Unspecified 03/21/2017, 02/09/2018, 02/01/2023   Meningococcal Conjugate 07/15/1994   Moderna Covid-19 Vaccine Bivalent Booster 10yrs & up 03/03/2021   PFIZER(Purple Top)SARS-COV-2 Vaccination 07/05/2019, 07/28/2019, 03/03/2020, 10/01/2020   Pfizer(Comirnaty)Fall Seasonal Vaccine 12 years and older 03/10/2022   Pneumococcal Conjugate-13 02/28/2019   Pneumococcal Polysaccharide-23 04/01/2017, 07/13/2021   Tdap 02/27/2007, 07/13/2021   Zoster Recombinant(Shingrix) 12/09/2017, 12/09/2017, 02/09/2018   Zoster, Live 02/12/2014    TDAP status: Up to date  Flu Vaccine status: Up to date  Pneumococcal vaccine status: Up to date  Covid-19 vaccine status: Declined, Education has been provided regarding the importance of this vaccine but patient still declined. Advised may receive this vaccine at local pharmacy or Health Dept.or vaccine clinic. Aware to provide a copy of the vaccination record if obtained from local pharmacy or Health Dept. Verbalized acceptance and understanding.  Qualifies for Shingles Vaccine? Yes   Zostavax completed Yes   Shingrix Completed?: Yes  Screening Tests Health Maintenance  Topic Date Due   COVID-19 Vaccine (7 - 2024-25 season) 01/23/2023   Colonoscopy  05/24/2024   Medicare Annual Wellness (AWV)  07/17/2024   DTaP/Tdap/Td (3 - Td or Tdap) 07/14/2031   Pneumonia Vaccine 50+ Years old  Completed   INFLUENZA VACCINE  Completed   Hepatitis C Screening  Completed   Zoster Vaccines- Shingrix  Completed   HPV VACCINES  Aged Out    Health Maintenance  Health Maintenance Due   Topic Date Due   COVID-19 Vaccine (7 - 2024-25 season) 01/23/2023    Colorectal cancer screening: Type of screening: Colonoscopy. Completed 05/24/14. Repeat every 10 years    Additional Screening:  Hepatitis C Screening: does qualify; Completed 04/01/17  Vision Screening: Recommended annual ophthalmology exams for early detection of glaucoma and other disorders of the eye. Is the patient up to date with their annual eye exam?  Yes  Who is the provider or what is the name of the office in which the patient attends annual eye exams? [facility] If pt is not established with a provider, would they like to be referred to a provider to establish care? No .   Dental Screening: Recommended annual dental exams for proper oral hygiene    Community Resource Referral / Chronic Care Management:  CRR required this visit?  No   CCM required this visit?  No     Plan:     I have personally reviewed and noted the following in the patient's chart:   Medical and social history Use  of alcohol, tobacco or illicit drugs  Current medications and supplements including opioid prescriptions. Patient is not currently taking opioid prescriptions. Functional ability and status Nutritional status Physical activity Advanced directives List of other physicians Hospitalizations, surgeries, and ER visits in previous 12 months Vitals Screenings to include cognitive, depression, and falls Referrals and appointments  In addition, I have reviewed and discussed with patient certain preventive protocols, quality metrics, and best practice recommendations. A written personalized care plan for preventive services as well as general preventive health recommendations were provided to patient.     Tillie Rung, LPN   [id number]/61/[id number]   After Visit Summary: (MyChart) Due to this being a telephonic visit, the after visit summary with patients personalized plan was offered to patient via MyChart   Nurse  Notes: None

## 2023-09-02 ENCOUNTER — Other Ambulatory Visit: Payer: Self-pay | Admitting: Family Medicine

## 2023-09-02 DIAGNOSIS — F411 Generalized anxiety disorder: Secondary | ICD-10-CM

## 2023-09-13 NOTE — Progress Notes (Signed)
 HPI:  [name] is a 73 y.o.male with a PMHx significant for HLD, anxiety, and Alzheimer's disease, who is here today with his wife for his routine physical examination.  Last CPE: 09/06/2022.  He saw dermatology on 02/11/2023.  His wife provides history today.  Exercise: He has been walking with his wife daily.  Diet: He is eating healthy in general and cooking at home mostly. He eats vegetables daily.  Sleep: He sleeps 8-10 hours per night.  Alcohol Use: He drinks a glass of wine 1-2 times per month.  Smoking: He has not smoked for years.  Vision: UTD on routine vision care.  Dental: UTD on routine dental care.   Immunization History  Administered Date(s) Administered   Fluad Quad(high Dose 65+) 02/05/2021, 02/09/2022   Hepatitis A 07/15/1994, 01/15/1995   Hepatitis B 07/15/1994, 09/15/1994, 01/15/1995   Influenza, High Dose Seasonal PF 03/31/2016, 02/09/2018, 01/30/2019, 02/25/2020   Influenza-Unspecified 03/21/2017, 02/09/2018, 02/01/2023   Meningococcal Conjugate 07/15/1994   Moderna Covid-19 Vaccine Bivalent Booster 79yrs & up 03/03/2021   PFIZER(Purple Top)SARS-COV-2 Vaccination 07/05/2019, 07/28/2019, 03/03/2020, 10/01/2020   Pfizer(Comirnaty)Fall Seasonal Vaccine 12 years and older 03/10/2022   Pneumococcal Conjugate-13 02/28/2019   Pneumococcal Polysaccharide-23 04/01/2017, 07/13/2021   Tdap 02/27/2007, 07/13/2021   Zoster Recombinant(Shingrix ) 12/09/2017, 12/09/2017, 02/09/2018   Zoster, Live 02/12/2014   Health Maintenance  Topic Date Due   COVID-19 Vaccine (7 - 2024-25 season) 01/23/2023   INFLUENZA VACCINE  12/23/2023   Colonoscopy  05/24/2024   Medicare Annual Wellness (AWV)  07/17/2024   DTaP/Tdap/Td (3 - Td or Tdap) 07/14/2031   Pneumonia Vaccine 36+ Years old  Completed   Hepatitis C Screening  Completed   Zoster Vaccines- Shingrix   Completed   HPV VACCINES  Aged Out   Meningococcal B Vaccine  Aged Out   Chronic medical problems:    Hyperlipidemia: Currently on Zetia  10 mg daily and simvastatin  20 mg daily.  Side effects from medication: none Lab Results  Component Value Date   CHOL 149 09/06/2022   HDL 47.30 09/06/2022   LDLCALC 64 09/06/2022   LDLDIRECT 91.0 09/04/2021   TRIG 188.0 (H) 09/06/2022   CHOLHDL 3 09/06/2022   Anxiety:  Currently on Buspar  7.5 mg three times daily and Sertraline  100 mg daily.  His wife states she says he has only been taking the Buspar  twice daily and feels that is better.    Alzheimer's disease:  Currently on Aricept  10 mg daily and Namenda  10 mg twice daily.  He sees neurology twice per year.  He has an upcoming appointment with neurology.  -He uses his albuterol  inhaler before his walk to prevent wheezing and DOE, both seem worse during this time of the year.   Review of Systems  Constitutional:  Negative for activity change, appetite change and fever.  HENT:  Negative for nosebleeds, sore throat and trouble swallowing.   Eyes:  Negative for redness and visual disturbance.  Respiratory:  Negative for cough, shortness of breath and wheezing.   Cardiovascular:  Negative for chest pain, palpitations and leg swelling.  Gastrointestinal:  Negative for abdominal pain, blood in stool, nausea and vomiting.  Endocrine: Negative for cold intolerance, heat intolerance, polydipsia, polyphagia and polyuria.  Genitourinary:  Negative for decreased urine volume, dysuria, hematuria and testicular pain.  Musculoskeletal:  Negative for gait problem and myalgias.  Skin:  Negative for color change and rash.  Allergic/Immunologic: Positive for environmental allergies.  Neurological:  Negative for syncope, weakness and headaches.  Psychiatric/Behavioral:  Negative for hallucinations and sleep disturbance.   All other systems reviewed and are negative.  Current Outpatient Medications on File Prior to Visit  Medication Sig Dispense Refill   albuterol  (VENTOLIN  HFA) 108 (90 Base) MCG/ACT  inhaler Inhale 2 puffs into the lungs every 6 (six) hours as needed for wheezing or shortness of breath. 8 g 0   aspirin EC 81 MG tablet Take 81 mg by mouth daily. Swallow whole.     busPIRone  (BUSPAR ) 7.5 MG tablet Take 1 tablet (7.5 mg total) by mouth 3 (three) times daily. 270 tablet 1   Coenzyme Q10 (COQ10) 100 MG CAPS Take 1 capsule by mouth daily.     donepezil  (ARICEPT ) 10 MG tablet Take 1 tablet daily 90 tablet 3   ezetimibe  (ZETIA ) 10 MG tablet TAKE ONE TABLET BY MOUTH ONE TIME DAILY 90 tablet 3   fexofenadine (ALLEGRA) 180 MG tablet      Flax OIL Take 1 tablet by mouth daily.     Garlic 1000 MG CAPS Take 1 capsule by mouth daily.     memantine  (NAMENDA ) 10 MG tablet Take 1 tablet twice a day 180 tablet 3   Multiple Vitamins-Minerals (MULTIVITAMIN WITH MINERALS) tablet Take 1 tablet by mouth daily.     Omega 3 1200 MG CAPS Take 1 capsule by mouth daily.     sertraline  (ZOLOFT ) 100 MG tablet Take 1 tablet by mouth once daily 90 tablet 2   simvastatin  (ZOCOR ) 20 MG tablet Take 1 tablet by mouth once daily 90 tablet 3   vitamin C (ASCORBIC ACID) 500 MG tablet Take 500 mg by mouth daily.     No current facility-administered medications on file prior to visit.    Past Medical History:  Diagnosis Date   Allergy    Dementia due to Alzheimer's disease    Generalized anxiety disorder 03/31/2016   History of head injury    Hyperlipemia, mixed 03/31/2016   Major depressive disorder     No past surgical history on file.  No Known Allergies  Family History  Problem Relation Age of Onset   Mental illness Mother    Memory loss Mother    COPD Father    Memory loss Brother        Older half-brother    Social History   Socioeconomic History   Marital status: Married    Spouse name: Not on file   Number of children: Not on file   Years of education: 13   Highest education level: Some college, no degree  Occupational History   Occupation: retired  Tobacco Use   Smoking  status: Former    Passive exposure: Past   Smokeless tobacco: Former    Types: Engineer, drilling   Vaping status: Never Used  Substance and Sexual Activity   Alcohol use: Yes    Alcohol/week: 7.0 standard drinks of alcohol    Types: 7 Glasses of wine per week    Comment: 1 glass every evening   Drug use: Never   Sexual activity: Yes  Other Topics Concern   Not on file  Social History Narrative   Right handed   One story home   Lives with wife   Social Drivers of Health   Financial Resource Strain: Low Risk  (07/18/2023)   Overall Financial Resource Strain (CARDIA)    Difficulty of Paying Living Expenses: Not hard at all  Food Insecurity: No Food Insecurity (07/18/2023)   Hunger Vital Sign  Worried About Programme researcher, broadcasting/film/video in the Last Year: Never true    Ran Out of Food in the Last Year: Never true  Transportation Needs: No Transportation Needs (07/18/2023)   PRAPARE - Administrator, Civil Service (Medical): No    Lack of Transportation (Non-Medical): No  Physical Activity: Insufficiently Active (07/18/2023)   Exercise Vital Sign    Days of Exercise per Week: 3 days    Minutes of Exercise per Session: 30 min  Stress: No Stress Concern Present (07/18/2023)   [employer] of Occupational Health - Occupational Stress Questionnaire    Feeling of Stress : Not at all  Recent Concern: Stress - Stress Concern Present (05/28/2023)   [employer] of Occupational Health - Occupational Stress Questionnaire    Feeling of Stress : To some extent  Social Connections: Socially Integrated (07/18/2023)   Social Connection and Isolation Panel [NHANES]    Frequency of Communication with Friends and Family: More than three times a week    Frequency of Social Gatherings with Friends and Family: More than three times a week    Attends Religious Services: More than 4 times per year    Active Member of [employer] or Organizations: Yes    Attends Engineer, structural: More  than 4 times per year    Marital Status: Married   Today's Vitals   09/14/23 0833  BP: 130/70  Pulse: 72  Resp: 16  Temp: 98.1 F (36.7 C)  TempSrc: Oral  SpO2: 96%  Weight: 168 lb (76.2 kg)  Height: 5\' 6"  (1.676 m)   Body mass index is 27.12 kg/m.  Wt Readings from Last 3 Encounters:  09/14/23 168 lb (76.2 kg)  07/18/23 165 lb (74.8 kg)  06/08/23 165 lb 8 oz (75.1 kg)   Physical Exam Vitals and nursing note reviewed.  Constitutional:      General: He is not in acute distress.    Appearance: He is well-developed.  HENT:     Head: Normocephalic and atraumatic.     Right Ear: External ear normal.     Left Ear: External ear normal. Tympanic membrane is not erythematous.     Ears:     Comments: Cerumen excess R>L. Left TM seen partially.     Mouth/Throat:     Mouth: Mucous membranes are moist.     Pharynx: Oropharynx is clear. Uvula midline.  Eyes:     Conjunctiva/sclera: Conjunctivae normal.     Pupils: Pupils are equal, round, and reactive to light.  Neck:     Thyroid : No thyroid  mass or thyromegaly.  Cardiovascular:     Rate and Rhythm: Normal rate and regular rhythm.     Pulses:          Dorsalis pedis pulses are 2+ on the right side and 2+ on the left side.     Heart sounds: No murmur heard. Pulmonary:     Effort: Pulmonary effort is normal. No respiratory distress.     Breath sounds: Normal breath sounds.  Abdominal:     Palpations: Abdomen is soft. There is no hepatomegaly or mass.     Tenderness: There is no abdominal tenderness.  Genitourinary:    Comments: No concerns. Musculoskeletal:        General: No tenderness.     Cervical back: Normal range of motion.     Right lower leg: No edema.     Left lower leg: No edema.     Comments: No signs  of synovitis.  Lymphadenopathy:     Cervical: No cervical adenopathy.  Skin:    General: Skin is warm.     Findings: No erythema or rash.  Neurological:     General: No focal deficit present.     Mental  Status: He is alert. Mental status is at baseline.     Motor: No weakness.     Gait: Gait normal.     Deep Tendon Reflexes:     Reflex Scores:      Bicep reflexes are 2+ on the right side and 2+ on the left side.      Patellar reflexes are 2+ on the right side and 2+ on the left side. Psychiatric:        Mood and Affect: Mood and affect normal.   ASSESSMENT AND PLAN:  Mr. Bok was seen today for his routine general medical examination.   Lab Results  Component Value Date   NA 139 09/14/2023   CL 104 09/14/2023   K 4.1 09/14/2023   CO2 26 09/14/2023   BUN 20 09/14/2023   CREATININE 0.83 09/14/2023   GFR 85.77 09/14/2023   CALCIUM 9.9 09/14/2023   ALBUMIN 4.6 09/14/2023   GLUCOSE 98 09/14/2023   Lab Results  Component Value Date   ALT 30 09/14/2023   AST 25 09/14/2023   ALKPHOS 61 09/14/2023   BILITOT 0.5 09/14/2023   Lab Results  Component Value Date   CHOL 189 09/14/2023   HDL 39.90 09/14/2023   LDLCALC 106 (H) 09/14/2023   LDLDIRECT 91.0 09/04/2021   TRIG 216.0 (H) 09/14/2023   CHOLHDL 5 09/14/2023   Routine general medical examination at a health care facility Assessment & Plan: We discussed the importance of regular physical activity and healthy diet for prevention of chronic illness and/or complications. Preventive guidelines reviewed. Vaccination up to date. Next CPE in a year.   Hyperlipemia, mixed Assessment & Plan: Continue simvastatin  20 mg daily and Zetia  10 mg daily as well as low fat diet. Further recommendation will be given according to lipid panel result.  Orders: -     Comprehensive metabolic panel with GFR; Future -     Lipid panel; Future  Generalized anxiety disorder Assessment & Plan: Problem has improved. Continue Buspar  7.5 mg bid and Sertraline  100 mg daily. F/U in 6 months.  Orders: -     busPIRone  HCl; Take 1 tablet (7.5 mg total) by mouth 2 (two) times daily.  Dispense: 180 tablet; Refill: 1  Return in 6 months (on  03/15/2024) for CPE, chronic problems.  I, Fritz Jewel Wierda, acting as a scribe for Ginnifer Creelman [place], MD., have documented all relevant documentation on the behalf of Khrystian Schauf [place], MD, as directed by  Thunder Bridgewater [place], MD while in the presence of Alexxis Mackert [place], MD.   I, Amelia Macken [place], MD, have reviewed all documentation for this visit. The documentation on 09/13/23 for the exam, diagnosis, procedures, and orders are all accurate and complete.  Cienna Dumais G. [place], MD  [facility]. [place] office.

## 2023-09-14 ENCOUNTER — Encounter: Payer: Self-pay | Admitting: Family Medicine

## 2023-09-14 ENCOUNTER — Ambulatory Visit (INDEPENDENT_AMBULATORY_CARE_PROVIDER_SITE_OTHER): Payer: Medicare HMO | Admitting: Family Medicine

## 2023-09-14 VITALS — BP 130/70 | HR 72 | Temp 98.1°F | Resp 16 | Ht 66.0 in | Wt 168.0 lb

## 2023-09-14 DIAGNOSIS — F411 Generalized anxiety disorder: Secondary | ICD-10-CM | POA: Diagnosis not present

## 2023-09-14 DIAGNOSIS — E782 Mixed hyperlipidemia: Secondary | ICD-10-CM | POA: Diagnosis not present

## 2023-09-14 DIAGNOSIS — Z0000 Encounter for general adult medical examination without abnormal findings: Secondary | ICD-10-CM

## 2023-09-14 LAB — COMPREHENSIVE METABOLIC PANEL WITH GFR
ALT: 30 U/L (ref 0–53)
AST: 25 U/L (ref 0–37)
Albumin: 4.6 g/dL (ref 3.5–5.2)
Alkaline Phosphatase: 61 U/L (ref 39–117)
BUN: 20 mg/dL (ref 6–23)
CO2: 26 meq/L (ref 19–32)
Calcium: 9.9 mg/dL (ref 8.4–10.5)
Chloride: 104 meq/L (ref 96–112)
Creatinine, Ser: 0.83 mg/dL (ref 0.40–1.50)
GFR: 85.77 mL/min (ref >60.00)
Glucose, Bld: 98 mg/dL (ref 70–99)
Potassium: 4.1 meq/L (ref 3.5–5.1)
Sodium: 139 meq/L (ref 135–145)
Total Bilirubin: 0.5 mg/dL (ref 0.2–1.2)
Total Protein: 7.4 g/dL (ref 6.0–8.3)

## 2023-09-14 LAB — LIPID PANEL
Cholesterol: 189 mg/dL (ref 0–200)
HDL: 39.9 mg/dL (ref >39.00)
LDL Cholesterol: 106 mg/dL — ABNORMAL HIGH (ref 0–99)
NonHDL: 148.91
Total CHOL/HDL Ratio: 5
Triglycerides: 216 mg/dL — ABNORMAL HIGH (ref 0.0–149.0)
VLDL: 43.2 mg/dL — ABNORMAL HIGH (ref 0.0–40.0)

## 2023-09-14 MED ORDER — BUSPIRONE HCL 7.5 MG PO TABS: 7.5000 mg | ORAL_TABLET | Freq: Two times a day (BID) | ORAL | 1 refills | Status: DC

## 2023-09-14 NOTE — Assessment & Plan Note (Signed)
 Continue simvastatin  20 mg daily and Zetia  10 mg daily as well as low fat diet. Further recommendation will be given according to lipid panel result.

## 2023-09-14 NOTE — Assessment & Plan Note (Signed)
We discussed the importance of regular physical activity and healthy diet for prevention of chronic illness and/or complications. Preventive guidelines reviewed. Vaccination up to date. Next CPE in a year. 

## 2023-09-14 NOTE — Patient Instructions (Addendum)
 A few things to remember from today's visit:  Routine general medical examination at a health care facility  Hyperlipemia, mixed - Plan: Comprehensive metabolic panel with GFR, Lipid panel  Generalized anxiety disorder  If you need refills for medications you take chronically, please call your pharmacy. Do not use My Chart to request refills or for acute issues that need immediate attention. If you send a my chart message, it may take a few days to be addressed, specially if I am not in the office.  Please be sure medication list is accurate. If a new problem present, please set up appointment sooner than planned today.

## 2023-09-14 NOTE — Assessment & Plan Note (Signed)
 Problem has improved. Continue Buspar  7.5 mg bid and Sertraline  100 mg daily. F/U in 6 months.

## 2023-10-19 ENCOUNTER — Ambulatory Visit: Payer: Medicare HMO | Admitting: Neurology

## 2023-10-19 ENCOUNTER — Encounter: Payer: Self-pay | Admitting: Neurology

## 2023-10-19 VITALS — BP 122/70 | HR 71 | Wt 165.8 lb

## 2023-10-19 DIAGNOSIS — F0280 Dementia in other diseases classified elsewhere without behavioral disturbance: Secondary | ICD-10-CM

## 2023-10-19 DIAGNOSIS — G309 Alzheimer's disease, unspecified: Secondary | ICD-10-CM | POA: Diagnosis not present

## 2023-10-19 MED ORDER — MEMANTINE HCL 10 MG PO TABS: ORAL_TABLET | ORAL | 3 refills | Status: DC

## 2023-10-19 MED ORDER — DONEPEZIL HCL 10 MG PO TABS: ORAL_TABLET | ORAL | 3 refills | Status: DC
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
## 2023-10-19 NOTE — Patient Instructions (Signed)
 Always good to see you.   Try increasing the Buspar  to three times a day  Continue all your medications  Recommend looking into day programs at the senior center  Follow-up in 6-8 months, call for any changes   FALL PRECAUTIONS: Be cautious when walking. Scan the area for obstacles that may increase the risk of trips and falls. When getting up in the mornings, sit up at the edge of the bed for a few minutes before getting out of bed. Consider elevating the bed at the head end to avoid drop of blood pressure when getting up. Walk always in a well-lit room (use night lights in the walls). Avoid area rugs or power cords from appliances in the middle of the walkways. Use a walker or a cane if necessary and consider physical therapy for balance exercise. Get your eyesight checked regularly.   HOME SAFETY: Consider the safety of the kitchen when operating appliances like stoves, microwave oven, and blender. Consider having supervision and share cooking responsibilities until no longer able to participate in those. Accidents with firearms and other hazards in the house should be identified and addressed as well.   ABILITY TO BE LEFT ALONE: If patient is unable to contact 911 operator, consider using LifeLine, or when the need is there, arrange for someone to stay with patients. Smoking is a fire hazard, consider supervision or cessation. Risk of wandering should be assessed by caregiver and if detected at any point, supervision and safe proof recommendations should be instituted.  RECOMMENDATIONS FOR ALL PATIENTS WITH MEMORY PROBLEMS: 1. Continue to exercise (Recommend 30 minutes of walking everyday, or 3 hours every week) 2. Increase social interactions - continue going to [place] and enjoy social gatherings with friends and family 3. Eat healthy, avoid fried foods and eat more fruits and vegetables 4. Maintain adequate blood pressure, blood sugar, and blood cholesterol level. Reducing the risk of  stroke and cardiovascular disease also helps promoting better memory. 5. Avoid stressful situations. Live a simple life and avoid aggravations. Organize your time and prepare for the next day in anticipation. 6. Sleep well, avoid any interruptions of sleep and avoid any distractions in the bedroom that may interfere with adequate sleep quality 7. Avoid sugar, avoid sweets as there is a strong link between excessive sugar intake, diabetes, and cognitive impairment The Mediterranean diet has been shown to help patients reduce the risk of progressive memory disorders and reduces cardiovascular risk. This includes eating fish, eat fruits and green leafy vegetables, nuts like almonds and hazelnuts, walnuts, and also use olive oil. Avoid fast foods and fried foods as much as possible. Avoid sweets and sugar as sugar use has been linked to worsening of memory function.  There is always a concern of gradual progression of memory problems. If this is the case, then we may need to adjust level of care according to patient needs. Support, both to the patient and caregiver, should then be put into place.

## 2023-11-10 ENCOUNTER — Ambulatory Visit: Payer: Self-pay

## 2023-11-10 NOTE — Telephone Encounter (Signed)
            FYI Only or Action Required?: FYI only for provider.  Patient was last seen in primary care on 09/14/2023 by [place], Randy G, MD. Called Nurse Triage reporting Wheezing. Symptoms began several months ago. Interventions attempted: Prescription medications: Inhaler, and cough medicine. Symptoms are: gradually worsening. Cough, wheezing and anxiety.  Triage Disposition: See PCP When Office is Open (Within 3 Days)  Patient/caregiver understands and will follow disposition?: Yes         Copied from CRM [id number]. Topic: Clinical - Red Word Triage >> Nov 10, 2023  9:27 AM Juluis Ok wrote: [facility] that prompted transfer to Nurse Triage: Cough, wheezing, anxiety Reason for Disposition  [1] MODERATE longstanding difficulty breathing (e.Bean., speaks in phrases, SOB even at rest, pulse 100-120) AND [2] SAME as normal  Answer Assessment - Initial Assessment Questions Call from wife - pt is wheezing at night, cough and anxiety.    1. RESPIRATORY STATUS: Describe your breathing? (e.Bean., wheezing, shortness of breath, unable to speak, severe coughing)      wheezing 2. ONSET: When did this breathing problem begin?      1 week 3. PATTERN Does the difficult breathing come and go, or has it been constant since it started?      constant 4. SEVERITY: How bad is your breathing? (e.Bean., mild, moderate, severe)    - MILD: No SOB at rest, mild SOB with walking, speaks normally in sentences, can lie down, no retractions, pulse < 100.    - MODERATE: SOB at rest, SOB with minimal exertion and prefers to sit, cannot lie down flat, speaks in phrases, mild retractions, audible wheezing, pulse 100-120.    - SEVERE: Very SOB at rest, speaks in single words, struggling to breathe, sitting hunched forward, retractions, pulse > 120      Mild - moderate - unless he is having an anxiety attack 5. RECURRENT SYMPTOM: Have you had difficulty breathing before? If Yes, ask: When was the last  time? and What happened that time?      yes 6. CARDIAC HISTORY: Do you have any history of heart disease? (e.Bean., heart attack, angina, bypass surgery, angioplasty)      no 7. LUNG HISTORY: Do you have any history of lung disease?  (e.Bean., pulmonary embolus, asthma, emphysema)     Difficulty breathing in heat and humidity 8. CAUSE: What do you think is causing the breathing problem?      Unsure - Anxiety certainly contributes 9. OTHER SYMPTOMS: Do you have any other symptoms? (e.Bean., dizziness, runny nose, cough, chest pain, fever)     Cough, anxiety  Protocols used: Breathing Difficulty-A-AH

## 2023-11-11 ENCOUNTER — Ambulatory Visit (INDEPENDENT_AMBULATORY_CARE_PROVIDER_SITE_OTHER): Admitting: Family Medicine

## 2023-11-11 ENCOUNTER — Ambulatory Visit

## 2023-11-11 ENCOUNTER — Encounter: Payer: Self-pay | Admitting: Family Medicine

## 2023-11-11 ENCOUNTER — Ambulatory Visit: Payer: Self-pay | Admitting: Family Medicine

## 2023-11-11 VITALS — BP 120/60 | HR 79 | Temp 97.8°F | Resp 16 | Ht 66.0 in | Wt 165.0 lb

## 2023-11-11 DIAGNOSIS — J989 Respiratory disorder, unspecified: Secondary | ICD-10-CM

## 2023-11-11 DIAGNOSIS — R059 Cough, unspecified: Secondary | ICD-10-CM

## 2023-11-11 DIAGNOSIS — R062 Wheezing: Secondary | ICD-10-CM | POA: Diagnosis not present

## 2023-11-11 MED ORDER — PREDNISONE 20 MG PO TABS: 40.0000 mg | ORAL_TABLET | Freq: Every day | ORAL | 0 refills | Status: AC

## 2023-11-11 NOTE — Progress Notes (Signed)
 ACUTE VISIT Chief Complaint  Patient presents with   Cough    More often in the last week & a half    Wheezing    About a week and a half    HPI: [name] is a 73 y.o. male with PMHx significant for anxiety and Alzheimer's disease ,  who is here today with his wife complaining of persistent cough and wheezing starting 1.5 weeks ago as described above. Wife provides hx.  His cough is at times productive.  Has not identified exacerbating or alleviating factors.  Cough This is a new problem. The current episode started 1 to 4 weeks ago. The problem has been unchanged. The problem occurs every few hours. The cough is Productive of sputum. Associated symptoms include nasal congestion, postnasal drip and rhinorrhea. Pertinent negatives include no chills, ear congestion, headaches, heartburn, myalgias, rash, sore throat or weight loss. Nothing aggravates the symptoms. He has tried a beta-agonist inhaler for the symptoms. The treatment provided mild relief. His past medical history is significant for environmental allergies.   He also endorses stress-induced CP, which his wife states is not new for him. He has seen cardiologist, unchanged. He uses his albuterol  inhaler when wheezing, which he has had for some time, exacerbated by exercise. Symptoms improve within an hour of using Albuterol  inh.  No sick contact.  Has also tried taking Robitussin.  Took his last dose of albuterol  inhaler last night and took Robitussin this morning around 8 am.   Pt is a former smoker and quit smoking in 1975.  Denies any fever, chills, fatigue,or heartburn. No new nasal congestion or ear fullness. No MS changes. Allergic rhinitis, stable symptoms. No known hx of asthma.  Review of Systems  Constitutional:  Negative for activity change, appetite change, chills, unexpected weight change and weight loss.  HENT:  Positive for postnasal drip and rhinorrhea. Negative for facial swelling, mouth sores,  sinus pain and sore throat.   Respiratory:  Negative for stridor.   Cardiovascular:  Negative for leg swelling.  Gastrointestinal:  Negative for abdominal pain, heartburn, nausea and vomiting.  Musculoskeletal:  Negative for myalgias.  Skin:  Negative for rash.  Allergic/Immunologic: Positive for environmental allergies.  Neurological:  Negative for syncope, weakness and headaches.  See other pertinent positives and negatives in HPI.  Current Outpatient Medications on File Prior to Visit  Medication Sig Dispense Refill   albuterol  (VENTOLIN  HFA) 108 (90 Base) MCG/ACT inhaler Inhale 2 puffs into the lungs every 6 (six) hours as needed for wheezing or shortness of breath. 8 g 0   aspirin EC 81 MG tablet Take 81 mg by mouth daily. Swallow whole.     busPIRone  (BUSPAR ) 7.5 MG tablet Take 1 tablet (7.5 mg total) by mouth 2 (two) times daily. 180 tablet 1   Coenzyme Q10 (COQ10) 100 MG CAPS Take 1 capsule by mouth daily.     donepezil  (ARICEPT ) 10 MG tablet Take 1 tablet daily 90 tablet 3   ezetimibe  (ZETIA ) 10 MG tablet TAKE ONE TABLET BY MOUTH ONE TIME DAILY 90 tablet 3   Flax OIL Take 1 tablet by mouth daily.     Garlic 1000 MG CAPS Take 1 capsule by mouth daily.     memantine  (NAMENDA ) 10 MG tablet Take 1 tablet twice a day 180 tablet 3   Multiple Vitamins-Minerals (MULTIVITAMIN WITH MINERALS) tablet Take 1 tablet by mouth daily.     Omega 3 1200 MG CAPS Take 1 capsule by mouth daily.  sertraline  (ZOLOFT ) 100 MG tablet Take 1 tablet by mouth once daily 90 tablet 2   simvastatin  (ZOCOR ) 20 MG tablet Take 1 tablet by mouth once daily 90 tablet 3   vitamin C (ASCORBIC ACID) 500 MG tablet Take 500 mg by mouth daily.     No current facility-administered medications on file prior to visit.    Past Medical History:  Diagnosis Date   Allergy    Dementia due to Alzheimer's disease    Generalized anxiety disorder 03/31/2016   History of head injury    Hyperlipemia, mixed 03/31/2016   Major  depressive disorder    No Known Allergies  Social History   Socioeconomic History   Marital status: Married    Spouse name: Not on file   Number of children: Not on file   Years of education: 13   Highest education level: Some college, no degree  Occupational History   Occupation: retired  Tobacco Use   Smoking status: Former    Passive exposure: Past   Smokeless tobacco: Former    Types: Engineer, drilling   Vaping status: Never Used  Substance and Sexual Activity   Alcohol use: Yes    Alcohol/week: 7.0 standard drinks of alcohol    Types: 7 Glasses of wine per week    Comment: 1 glass every evening   Drug use: Never   Sexual activity: Yes  Other Topics Concern   Not on file  Social History Narrative   Right handed   One story home   Lives with wife   Social Drivers of Health   Financial Resource Strain: Low Risk  (07/18/2023)   Overall Financial Resource Strain (CARDIA)    Difficulty of Paying Living Expenses: Not hard at all  Food Insecurity: No Food Insecurity (07/18/2023)   Hunger Vital Sign    Worried About Running Out of Food in the Last Year: Never true    Ran Out of Food in the Last Year: Never true  Transportation Needs: No Transportation Needs (07/18/2023)   PRAPARE - Administrator, Civil Service (Medical): No    Lack of Transportation (Non-Medical): No  Physical Activity: Insufficiently Active (07/18/2023)   Exercise Vital Sign    Days of Exercise per Week: 3 days    Minutes of Exercise per Session: 30 min  Stress: No Stress Concern Present (07/18/2023)   [employer] of Occupational Health - Occupational Stress Questionnaire    Feeling of Stress : Not at all  Recent Concern: Stress - Stress Concern Present (05/28/2023)   [employer] of Occupational Health - Occupational Stress Questionnaire    Feeling of Stress : To some extent  Social Connections: Socially Integrated (07/18/2023)   Social Connection and Isolation Panel     Frequency of Communication with Friends and Family: More than three times a week    Frequency of Social Gatherings with Friends and Family: More than three times a week    Attends Religious Services: More than 4 times per year    Active Member of [employer] or Organizations: Yes    Attends Banker Meetings: More than 4 times per year    Marital Status: Married   Vitals:   11/11/23 1049  BP: 120/60  Pulse: 79  Resp: 16  Temp: 97.8 F (36.6 C)  SpO2: 98%   Body mass index is 26.63 kg/m.  Physical Exam Vitals and nursing note reviewed.  Constitutional:      General: He is not  in acute distress.    Appearance: He is well-developed. He is not ill-appearing.  HENT:     Head: Normocephalic and atraumatic.     Nose: Rhinorrhea present.     Mouth/Throat:     Mouth: Mucous membranes are moist.     Pharynx: Oropharynx is clear.   Eyes:     Conjunctiva/sclera: Conjunctivae normal.    Cardiovascular:     Rate and Rhythm: Normal rate and regular rhythm.     Heart sounds: No murmur heard. Pulmonary:     Effort: Pulmonary effort is normal. Prolonged expiration present. No respiratory distress.     Breath sounds: Normal breath sounds. No stridor. No wheezing, rhonchi or rales.  Lymphadenopathy:     Cervical: No cervical adenopathy.   Skin:    General: Skin is warm.     Findings: No erythema or rash.   Neurological:     General: No focal deficit present.     Mental Status: He is alert. Mental status is at baseline.     Gait: Gait normal.   Psychiatric:        Mood and Affect: Mood and affect normal.   ASSESSMENT AND PLAN: [name] was seen today for a cough.  Cough, unspecified type We discussed possible etiologies. ?  Allergy related. Lung auscultation today otherwise negative except for prolonged inspiration. Chest x-ray ordered. Monitor for new symptoms.  -     DG Chest 2 View; Future  Reactive airway disease without asthma We discussed possible  etiologies, history does not suggest an infectious process. ?  Asthma, COPD. After discussion of some side effects, he agrees with trying prednisone 40 mg daily with breakfast for 5 days. Albuterol  inh 2 puff every 6 hours for a week then as needed for wheezing or shortness of breath.  If persistent, we can consider pulmonology referral and/or empiric treatment with LABA/LAMA/ICS agent.  -     predniSONE; Take 2 tablets (40 mg total) by mouth daily with breakfast for 5 days.  Dispense: 10 tablet; Refill: 0  Return if symptoms worsen or fail to improve, for keep next appointment.  I, Bernita Bristle, acting as a scribe for Traevion Poehler [place], MD., have documented all relevant documentation on the behalf of Solomiya Pascale [place], MD, as directed by   while in the presence of Hend Mccarrell [place], MD.  I, Cigi Bega [place], MD, have reviewed all documentation for this visit. The documentation on 11/11/23 for the exam, diagnosis, procedures, and orders are all accurate and complete.  Dalayla Aldredge G. [place], MD  [facility]. [place] office.

## 2023-11-11 NOTE — Patient Instructions (Addendum)
 A few things to remember from today's visit:  Cough, unspecified type - Plan: DG Chest 2 View  Reactive airway disease without asthma - Plan: predniSONE (DELTASONE) 20 MG tablet Prednisone with breakfast. Albuterol  inh 2 puff every 6 hours for a week then as needed for wheezing or shortness of breath.  Monitor for fever.  If you need refills for medications you take chronically, please call your pharmacy. Do not use My Chart to request refills or for acute issues that need immediate attention. If you send a my chart message, it may take a few days to be addressed, specially if I am not in the office.  Please be sure medication list is accurate. If a new problem present, please set up appointment sooner than planned today.

## 2023-11-15 DIAGNOSIS — Z0100 Encounter for examination of eyes and vision without abnormal findings: Secondary | ICD-10-CM | POA: Diagnosis not present

## 2023-11-15 DIAGNOSIS — H5203 Hypermetropia, bilateral: Secondary | ICD-10-CM | POA: Diagnosis not present

## 2023-12-21 ENCOUNTER — Other Ambulatory Visit: Payer: Self-pay | Admitting: Family Medicine

## 2023-12-21 DIAGNOSIS — E785 Hyperlipidemia, unspecified: Secondary | ICD-10-CM

## 2024-01-05 ENCOUNTER — Other Ambulatory Visit: Payer: Self-pay | Admitting: Family Medicine

## 2024-01-05 DIAGNOSIS — R0609 Other forms of dyspnea: Secondary | ICD-10-CM

## 2024-02-25 ENCOUNTER — Other Ambulatory Visit: Payer: Self-pay | Admitting: Family Medicine

## 2024-02-25 DIAGNOSIS — E785 Hyperlipidemia, unspecified: Secondary | ICD-10-CM

## 2024-02-28 DIAGNOSIS — L820 Inflamed seborrheic keratosis: Secondary | ICD-10-CM | POA: Diagnosis not present

## 2024-02-28 DIAGNOSIS — L218 Other seborrheic dermatitis: Secondary | ICD-10-CM | POA: Diagnosis not present

## 2024-02-28 DIAGNOSIS — L918 Other hypertrophic disorders of the skin: Secondary | ICD-10-CM | POA: Diagnosis not present

## 2024-02-28 DIAGNOSIS — L570 Actinic keratosis: Secondary | ICD-10-CM | POA: Diagnosis not present

## 2024-02-28 DIAGNOSIS — D225 Melanocytic nevi of trunk: Secondary | ICD-10-CM | POA: Diagnosis not present

## 2024-02-28 DIAGNOSIS — L821 Other seborrheic keratosis: Secondary | ICD-10-CM | POA: Diagnosis not present

## 2024-02-28 DIAGNOSIS — Z85828 Personal history of other malignant neoplasm of skin: Secondary | ICD-10-CM | POA: Diagnosis not present

## 2024-03-06 DIAGNOSIS — M5432 Sciatica, left side: Secondary | ICD-10-CM | POA: Diagnosis not present

## 2024-03-06 DIAGNOSIS — M545 Low back pain, unspecified: Secondary | ICD-10-CM | POA: Diagnosis not present

## 2024-03-07 ENCOUNTER — Other Ambulatory Visit: Payer: Self-pay | Admitting: Family Medicine

## 2024-03-07 DIAGNOSIS — F411 Generalized anxiety disorder: Secondary | ICD-10-CM

## 2024-03-19 ENCOUNTER — Encounter: Payer: Self-pay | Admitting: Family Medicine

## 2024-03-19 ENCOUNTER — Ambulatory Visit: Admitting: Family Medicine

## 2024-03-19 VITALS — BP 138/78 | HR 61 | Temp 97.6°F | Resp 16 | Ht 65.32 in | Wt 162.2 lb

## 2024-03-19 DIAGNOSIS — E782 Mixed hyperlipidemia: Secondary | ICD-10-CM

## 2024-03-19 DIAGNOSIS — J309 Allergic rhinitis, unspecified: Secondary | ICD-10-CM | POA: Insufficient documentation

## 2024-03-19 DIAGNOSIS — R062 Wheezing: Secondary | ICD-10-CM

## 2024-03-19 DIAGNOSIS — J301 Allergic rhinitis due to pollen: Secondary | ICD-10-CM

## 2024-03-19 DIAGNOSIS — F411 Generalized anxiety disorder: Secondary | ICD-10-CM

## 2024-03-19 MED ORDER — IPRATROPIUM BROMIDE 0.06 % NA SOLN: 2.0000 | Freq: Four times a day (QID) | NASAL | 2 refills | Status: AC

## 2024-03-19 NOTE — Patient Instructions (Addendum)
 A few things to remember from today's visit:  Hyperlipemia, mixed  Allergic rhinitis due to pollen, unspecified seasonality - Plan: ipratropium (ATROVENT) 0.06 % nasal spray  Wheezing Atrovent nasal spray 2 times daily and 2 extra times if needed. Nasal saline irrigations. If oral allergy med is not helping, stop it. Rest uncharged.  If you need refills for medications you take chronically, please call your pharmacy. Do not use My Chart to request refills or for acute issues that need immediate attention. If you send a my chart message, it may take a few days to be addressed, specially if I am not in the office.  Please be sure medication list is accurate. If a new problem present, please set up appointment sooner than planned today.

## 2024-03-19 NOTE — Assessment & Plan Note (Signed)
 OTC antihistamines do not seem to be helping, so recommend discontinue the one he is taking at this time. We discussed other treatment options, he agrees with trying Atrovent nasal spray once twice daily and 2 more if needed. Nasal saline irrigations as needed throughout the day may also help. We also discussed the possibility of immunology referral if significant symptoms continue despite of treatment.

## 2024-03-19 NOTE — Assessment & Plan Note (Signed)
 Stable. Continue Buspar  7.5 mg bid and Sertraline  100 mg daily. Follow-up in 08/2024 during his CPE.

## 2024-03-19 NOTE — Progress Notes (Signed)
 Chief Complaint  Patient presents with   Follow-up   Discussed the use of AI scribe software for clinical note transcription with the patient, who gave verbal consent to proceed.  History of Present Illness Randy Bean is a 73 year old male with past medical history significant for anxiety, Alzheimer's disease, and hyperlipidemia here today with his wife for follow-up. His wife provides hx. He was last seen on 11/11/2023 for cough and wheezing, symptoms greatly improved after treatment with prednisone .  He has been experiencing a nonproductive cough and occasional episodes of wheezing, the latter one usually in the morning, noted by his wife when he is asleep and seem to be from throat rather that from chest.No associated SOB.  He uses albuterol  inh before going outside for a long walking, which helps prevent exhaustion/DOE and chest tightness sensation. Wheezing and cough seem to correlate with high pollen levels. CXR on 11/11/2023 was negative for active cardiopulmonary disease.  Clear rhinorrhea and nasal congestion, OTC antihistaminics do not seem to be working, currently he is on cetirizine 10 mg daily. No associated fever or sore throat.  Hyperlipidemia: Currently on simvastatin  20 mg and Zetia  10 mg. His cholesterol levels were last checked in April, showing an increase in LDL from 64 to 106.  According to his wife, he has been consistent with taking medications daily but acknowledges eating out more frequently, almost daily.  She is trying to decrease it to at least 3 times per week.  Lab Results  Component Value Date   CHOL 189 09/14/2023   HDL 39.90 09/14/2023   LDLCALC 106 (H) 09/14/2023   LDLDIRECT 91.0 09/04/2021   TRIG 216.0 (H) 09/14/2023   CHOLHDL 5 09/14/2023   Anxiety: He is also on Buspar  7.5 mg twice daily f and sertraline  100 mg daily.  He is wife reports that anxiety is still present but not as severe as before. He is tolerating medication well.  His  wife mentions that about 2 weeks ago he experienced sciatic nerve pain after twisting while getting out of a car. He was evaluated in a local acute care facility and received muscle relaxants.symptoms have resolved.  Review of Systems  Constitutional:  Negative for activity change, appetite change, chills and fever.  HENT:  Positive for postnasal drip.   Eyes:  Negative for discharge and redness.  Gastrointestinal:  Negative for abdominal pain, nausea and vomiting.  Skin:  Negative for rash.  Allergic/Immunologic: Positive for environmental allergies.  Neurological:  Negative for syncope, facial asymmetry and weakness.  See other pertinent positives and negatives in HPI.  Current Outpatient Medications on File Prior to Visit  Medication Sig Dispense Refill   albuterol  (VENTOLIN  HFA) 108 (90 Base) MCG/ACT inhaler INHALE 2 PUFFS BY MOUTH EVERY 6 HOURS AS NEEDED FOR WHEEZING FOR SHORTNESS OF BREATH 9 g 0   aspirin EC 81 MG tablet Take 81 mg by mouth daily. Swallow whole.     busPIRone  (BUSPAR ) 7.5 MG tablet Take 1 tablet by mouth twice daily 180 tablet 0   Coenzyme Q10 (COQ10) 100 MG CAPS Take 1 capsule by mouth daily.     donepezil  (ARICEPT ) 10 MG tablet Take 1 tablet daily 90 tablet 3   ezetimibe  (ZETIA ) 10 MG tablet TAKE ONE TABLET BY MOUTH ONCE A DAY 90 tablet 0   Flax OIL Take 1 tablet by mouth daily.     memantine  (NAMENDA ) 10 MG tablet Take 1 tablet twice a day 180 tablet 3   Multiple  Vitamins-Minerals (MULTIVITAMIN WITH MINERALS) tablet Take 1 tablet by mouth daily.     Omega 3 1200 MG CAPS Take 1 capsule by mouth daily.     sertraline  (ZOLOFT ) 100 MG tablet Take 1 tablet by mouth once daily 90 tablet 2   simvastatin  (ZOCOR ) 20 MG tablet Take 1 tablet by mouth once daily 90 tablet 3   vitamin C (ASCORBIC ACID) 500 MG tablet Take 500 mg by mouth daily.     Garlic 1000 MG CAPS Take 1 capsule by mouth daily. (Patient not taking: Reported on 03/19/2024)     No current  facility-administered medications on file prior to visit.    Past Medical History:  Diagnosis Date   Allergy    Dementia due to Alzheimer's disease    Generalized anxiety disorder 03/31/2016   History of head injury    Hyperlipemia, mixed 03/31/2016   Major depressive disorder    No Known Allergies  Social History   Socioeconomic History   Marital status: Married    Spouse name: Not on file   Number of children: Not on file   Years of education: 13   Highest education level: 12th grade  Occupational History   Occupation: retired  Tobacco Use   Smoking status: Former    Passive exposure: Past   Smokeless tobacco: Former    Types: Engineer, Drilling   Vaping status: Never Used  Substance and Sexual Activity   Alcohol use: Yes    Alcohol/week: 7.0 standard drinks of alcohol    Types: 7 Glasses of wine per week    Comment: 1 glass every evening   Drug use: Never   Sexual activity: Yes  Other Topics Concern   Not on file  Social History Narrative   Right handed   One story home   Lives with wife   Social Drivers of Health   Financial Resource Strain: Low Risk  (03/13/2024)   Overall Financial Resource Strain (CARDIA)    Difficulty of Paying Living Expenses: Not hard at all  Food Insecurity: No Food Insecurity (03/13/2024)   Hunger Vital Sign    Worried About Running Out of Food in the Last Year: Never true    Ran Out of Food in the Last Year: Never true  Transportation Needs: No Transportation Needs (03/13/2024)   PRAPARE - Administrator, Civil Service (Medical): No    Lack of Transportation (Non-Medical): No  Physical Activity: Insufficiently Active (03/13/2024)   Exercise Vital Sign    Days of Exercise per Week: 4 days    Minutes of Exercise per Session: 20 min  Stress: Stress Concern Present (03/13/2024)   [employer] of Occupational Health - Occupational Stress Questionnaire    Feeling of Stress: Rather much  Social Connections:  Moderately Integrated (03/13/2024)   Social Connection and Isolation Panel    Frequency of Communication with Friends and Family: Once a week    Frequency of Social Gatherings with Friends and Family: Once a week    Attends Religious Services: More than 4 times per year    Active Member of [employer] or Organizations: Yes    Attends Engineer, Structural: More than 4 times per year    Marital Status: Married   Vitals:   03/19/24 0800  BP: 138/78  Pulse: 61  Resp: 16  Temp: 97.6 F (36.4 C)  SpO2: 98%   Body mass index is 26.73 kg/m.  Physical Exam Vitals and nursing note reviewed.  Constitutional:  General: He is not in acute distress.    Appearance: He is well-developed. He is not ill-appearing.  HENT:     Head: Normocephalic and atraumatic.     Nose: Rhinorrhea present. No congestion.     Right Turbinates: Not enlarged.     Left Turbinates: Not enlarged.     Mouth/Throat:     Mouth: Mucous membranes are moist.  Eyes:     Conjunctiva/sclera: Conjunctivae normal.  Cardiovascular:     Rate and Rhythm: Normal rate and regular rhythm.     Heart sounds: No murmur heard. Pulmonary:     Effort: Pulmonary effort is normal. No respiratory distress.     Breath sounds: Normal breath sounds.  Abdominal:     Palpations: Abdomen is soft. There is no mass.     Tenderness: There is no abdominal tenderness.  Musculoskeletal:     Right lower leg: No edema.     Left lower leg: No edema.  Lymphadenopathy:     Cervical: No cervical adenopathy.  Skin:    General: Skin is warm.     Findings: No erythema or rash.  Neurological:     General: No focal deficit present.     Mental Status: He is alert. Mental status is at baseline.     Gait: Gait normal.  Psychiatric:        Mood and Affect: Mood and affect normal.    ASSESSMENT AND PLAN:  [name] was seen today for follow-up.  Diagnoses and all orders for this visit:  Hyperlipemia, mixed Assessment &  Plan: Last LDL 106 in 08/2023 (64).  He has been consistent with taking medications but eating out more frequent. For now recommend continuing ezetimibe  10 mg daily and simvastatin  20 mg daily. We can plan on checking fasting lipid panel in 08/2024.  Allergic rhinitis due to pollen, unspecified seasonality Assessment & Plan: OTC antihistamines do not seem to be helping, so recommend discontinue the one he is taking at this time. We discussed other treatment options, he agrees with trying Atrovent nasal spray once twice daily and 2 more if needed. Nasal saline irrigations as needed throughout the day may also help. We also discussed the possibility of immunology referral if significant symptoms continue despite of treatment.  Orders: -     Ipratropium Bromide; Place 2 sprays into both nostrils 4 (four) times daily.  Dispense: 15 mL; Refill: 2  Wheezing We reviewed possible etiologies, including asthma and COPD. No hx of smoking, did chew tobacco in the past. We discussed diagnostic testing that can be pursued at this time to establish causes. We decided to hold on further work-up/ pulmonology referral for now. He has been evaluated by cardiologist for exertional chest tightness. Continue albuterol  inhaler 1 to 2 puff before engaging in moderate intensity exercise.  Generalized anxiety disorder Assessment & Plan: Stable. Continue Buspar  7.5 mg bid and Sertraline  100 mg daily. Follow-up in 08/2024 during his CPE.  Return in 6 months (on 09/17/2024) for CPE.  Shalyn Koral G. Bedelia Pong, MD  [facility]. [place] office.

## 2024-03-19 NOTE — Assessment & Plan Note (Signed)
 Last LDL 106 (64).  He has been consistent with taking medications but eating out more frequent. For now recommend continuing ezetimibe  10 mg daily and simvastatin  20 mg daily. We can plan on checking fasting lipid panel in 08/2024.

## 2024-04-03 ENCOUNTER — Encounter: Payer: Self-pay | Admitting: Neurology

## 2024-05-27 ENCOUNTER — Other Ambulatory Visit: Payer: Self-pay | Admitting: Family Medicine

## 2024-05-27 DIAGNOSIS — F411 Generalized anxiety disorder: Secondary | ICD-10-CM

## 2024-06-02 ENCOUNTER — Other Ambulatory Visit: Payer: Self-pay | Admitting: Family Medicine

## 2024-06-02 DIAGNOSIS — F411 Generalized anxiety disorder: Secondary | ICD-10-CM

## 2024-06-04 ENCOUNTER — Other Ambulatory Visit: Payer: Self-pay | Admitting: Family Medicine

## 2024-06-04 ENCOUNTER — Encounter: Payer: Self-pay | Admitting: Neurology

## 2024-06-04 ENCOUNTER — Ambulatory Visit: Admitting: Neurology

## 2024-06-04 VITALS — BP 156/79 | HR 74 | Ht 67.0 in | Wt 167.8 lb

## 2024-06-04 DIAGNOSIS — F02B11 Dementia in other diseases classified elsewhere, moderate, with agitation: Secondary | ICD-10-CM | POA: Diagnosis not present

## 2024-06-04 DIAGNOSIS — G301 Alzheimer's disease with late onset: Secondary | ICD-10-CM

## 2024-06-04 DIAGNOSIS — E785 Hyperlipidemia, unspecified: Secondary | ICD-10-CM

## 2024-06-04 MED ORDER — DONEPEZIL HCL 10 MG PO TABS: ORAL_TABLET | ORAL | 3 refills | Status: AC

## 2024-06-04 MED ORDER — MEMANTINE HCL 10 MG PO TABS: ORAL_TABLET | ORAL | 3 refills | Status: AC
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
## 2024-06-04 NOTE — Patient Instructions (Signed)
 It's good to see you.  Schedule EKG. Once done and we get the okay about normal study, we can start Rexulti titration sample.  2. Continue all your other medications  3. Continue close supervision and day programs  4. Follow-up in 6 months, call for any changes   FALL PRECAUTIONS: Be cautious when walking. Scan the area for obstacles that may increase the risk of trips and falls. When getting up in the mornings, sit up at the edge of the bed for a few minutes before getting out of bed. Consider elevating the bed at the head end to avoid drop of blood pressure when getting up. Walk always in a well-lit room (use night lights in the walls). Avoid area rugs or power cords from appliances in the middle of the walkways. Use a walker or a cane if necessary and consider physical therapy for balance exercise. Get your eyesight checked regularly.   HOME SAFETY: Consider the safety of the kitchen when operating appliances like stoves, microwave oven, and blender. Consider having supervision and share cooking responsibilities until no longer able to participate in those. Accidents with firearms and other hazards in the house should be identified and addressed as well.  ABILITY TO BE LEFT ALONE: If patient is unable to contact 911 operator, consider using LifeLine, or when the need is there, arrange for someone to stay with patients. Smoking is a fire hazard, consider supervision or cessation. Risk of wandering should be assessed by caregiver and if detected at any point, supervision and safe proof recommendations should be instituted.   RECOMMENDATIONS FOR ALL PATIENTS WITH MEMORY PROBLEMS: 1. Continue to exercise (Recommend 30 minutes of walking everyday, or 3 hours every week) 2. Increase social interactions - continue going to [place] and enjoy social gatherings with friends and family 3. Eat healthy, avoid fried foods and eat more fruits and vegetables 4. Maintain adequate blood pressure, blood sugar,  and blood cholesterol level. Reducing the risk of stroke and cardiovascular disease also helps promoting better memory. 5. Avoid stressful situations. Live a simple life and avoid aggravations. Organize your time and prepare for the next day in anticipation. 6. Sleep well, avoid any interruptions of sleep and avoid any distractions in the bedroom that may interfere with adequate sleep quality 7. Avoid sugar, avoid sweets as there is a strong link between excessive sugar intake, diabetes, and cognitive impairment The Mediterranean diet has been shown to help patients reduce the risk of progressive memory disorders and reduces cardiovascular risk. This includes eating fish, eat fruits and green leafy vegetables, nuts like almonds and hazelnuts, walnuts, and also use olive oil. Avoid fast foods and fried foods as much as possible. Avoid sweets and sugar as sugar use has been linked to worsening of memory function.  There is always a concern of gradual progression of memory problems. If this is the case, then we may need to adjust level of care according to patient needs. Support, both to the patient and caregiver, should then be put into place.

## 2024-07-23 ENCOUNTER — Ambulatory Visit: Payer: Medicare HMO

## 2024-09-17 ENCOUNTER — Encounter: Admitting: Family Medicine

## 2024-12-10 ENCOUNTER — Ambulatory Visit: Payer: Self-pay | Admitting: Neurology
